# Patient Record
Sex: Female | Born: 1970 | Race: White | Hispanic: No | Smoking: Current every day smoker
Health system: Southern US, Community
[De-identification: ages and names within clinical notes are randomized; demographics above are authoritative.]

## PROBLEM LIST (undated history)

## (undated) DIAGNOSIS — J45909 Unspecified asthma, uncomplicated: Secondary | ICD-10-CM

## (undated) DIAGNOSIS — E079 Disorder of thyroid, unspecified: Secondary | ICD-10-CM

## (undated) DIAGNOSIS — J40 Bronchitis, not specified as acute or chronic: Secondary | ICD-10-CM

## (undated) HISTORY — PX: CHOLECYSTECTOMY: SHX55

## (undated) HISTORY — PX: TUBAL LIGATION: SHX77

---

## 1997-10-29 ENCOUNTER — Encounter: Admission: RE | Admit: 1997-10-29 | Discharge: 1997-10-29 | Payer: Self-pay | Admitting: Family Medicine

## 1997-11-05 ENCOUNTER — Encounter: Admission: RE | Admit: 1997-11-05 | Discharge: 1997-11-05 | Payer: Self-pay | Admitting: Family Medicine

## 1998-02-02 ENCOUNTER — Encounter: Admission: RE | Admit: 1998-02-02 | Discharge: 1998-02-02 | Payer: Self-pay | Admitting: Family Medicine

## 1998-03-03 ENCOUNTER — Encounter: Admission: RE | Admit: 1998-03-03 | Discharge: 1998-03-03 | Payer: Self-pay | Admitting: Sports Medicine

## 1998-03-05 ENCOUNTER — Encounter: Admission: RE | Admit: 1998-03-05 | Discharge: 1998-03-05 | Payer: Self-pay | Admitting: Family Medicine

## 1998-03-17 ENCOUNTER — Other Ambulatory Visit: Admission: RE | Admit: 1998-03-17 | Discharge: 1998-03-17 | Payer: Self-pay | Admitting: Family Medicine

## 1998-03-17 ENCOUNTER — Encounter: Admission: RE | Admit: 1998-03-17 | Discharge: 1998-03-17 | Payer: Self-pay | Admitting: Family Medicine

## 1998-03-24 ENCOUNTER — Encounter: Admission: RE | Admit: 1998-03-24 | Discharge: 1998-03-24 | Payer: Self-pay | Admitting: Family Medicine

## 1998-04-07 ENCOUNTER — Encounter: Admission: RE | Admit: 1998-04-07 | Discharge: 1998-04-07 | Payer: Self-pay | Admitting: Family Medicine

## 1998-04-08 ENCOUNTER — Encounter: Admission: RE | Admit: 1998-04-08 | Discharge: 1998-04-08 | Payer: Self-pay | Admitting: Family Medicine

## 1998-04-23 ENCOUNTER — Encounter: Admission: RE | Admit: 1998-04-23 | Discharge: 1998-04-23 | Payer: Self-pay | Admitting: Family Medicine

## 1999-08-01 ENCOUNTER — Emergency Department (HOSPITAL_COMMUNITY): Admission: EM | Admit: 1999-08-01 | Discharge: 1999-08-01 | Payer: Self-pay | Admitting: *Deleted

## 2000-08-10 ENCOUNTER — Emergency Department (HOSPITAL_COMMUNITY): Admission: EM | Admit: 2000-08-10 | Discharge: 2000-08-10 | Payer: Self-pay | Admitting: Emergency Medicine

## 2000-08-10 ENCOUNTER — Encounter: Payer: Self-pay | Admitting: Emergency Medicine

## 2000-08-31 ENCOUNTER — Ambulatory Visit (HOSPITAL_COMMUNITY): Admission: RE | Admit: 2000-08-31 | Discharge: 2000-08-31 | Payer: Self-pay | Admitting: Family Medicine

## 2000-08-31 ENCOUNTER — Encounter: Payer: Self-pay | Admitting: Family Medicine

## 2000-09-22 ENCOUNTER — Ambulatory Visit (HOSPITAL_COMMUNITY): Admission: RE | Admit: 2000-09-22 | Discharge: 2000-09-22 | Payer: Self-pay | Admitting: Family Medicine

## 2000-09-22 ENCOUNTER — Encounter: Payer: Self-pay | Admitting: Family Medicine

## 2000-09-25 ENCOUNTER — Ambulatory Visit (HOSPITAL_COMMUNITY): Admission: RE | Admit: 2000-09-25 | Discharge: 2000-09-25 | Payer: Self-pay | Admitting: General Surgery

## 2000-09-28 ENCOUNTER — Encounter: Payer: Self-pay | Admitting: Emergency Medicine

## 2000-09-28 ENCOUNTER — Emergency Department (HOSPITAL_COMMUNITY): Admission: EM | Admit: 2000-09-28 | Discharge: 2000-09-28 | Payer: Self-pay | Admitting: Emergency Medicine

## 2000-11-11 ENCOUNTER — Emergency Department (HOSPITAL_COMMUNITY): Admission: EM | Admit: 2000-11-11 | Discharge: 2000-11-11 | Payer: Self-pay | Admitting: Emergency Medicine

## 2000-12-13 ENCOUNTER — Emergency Department (HOSPITAL_COMMUNITY): Admission: EM | Admit: 2000-12-13 | Discharge: 2000-12-13 | Payer: Self-pay | Admitting: Emergency Medicine

## 2000-12-26 ENCOUNTER — Emergency Department (HOSPITAL_COMMUNITY): Admission: EM | Admit: 2000-12-26 | Discharge: 2000-12-26 | Payer: Self-pay | Admitting: Emergency Medicine

## 2001-02-11 ENCOUNTER — Emergency Department (HOSPITAL_COMMUNITY): Admission: EM | Admit: 2001-02-11 | Discharge: 2001-02-11 | Payer: Self-pay | Admitting: Emergency Medicine

## 2002-09-28 ENCOUNTER — Emergency Department (HOSPITAL_COMMUNITY): Admission: AD | Admit: 2002-09-28 | Discharge: 2002-09-28 | Payer: Self-pay | Admitting: Emergency Medicine

## 2002-09-29 ENCOUNTER — Encounter: Payer: Self-pay | Admitting: Emergency Medicine

## 2002-09-30 ENCOUNTER — Emergency Department (HOSPITAL_COMMUNITY): Admission: EM | Admit: 2002-09-30 | Discharge: 2002-09-30 | Payer: Self-pay | Admitting: Emergency Medicine

## 2002-10-08 ENCOUNTER — Emergency Department (HOSPITAL_COMMUNITY): Admission: EM | Admit: 2002-10-08 | Discharge: 2002-10-08 | Payer: Self-pay | Admitting: Emergency Medicine

## 2002-10-08 ENCOUNTER — Encounter: Payer: Self-pay | Admitting: Emergency Medicine

## 2003-10-08 ENCOUNTER — Emergency Department (HOSPITAL_COMMUNITY): Admission: EM | Admit: 2003-10-08 | Discharge: 2003-10-08 | Payer: Self-pay | Admitting: Family Medicine

## 2004-01-28 ENCOUNTER — Other Ambulatory Visit: Admission: RE | Admit: 2004-01-28 | Discharge: 2004-01-28 | Payer: Self-pay | Admitting: Obstetrics and Gynecology

## 2004-02-06 ENCOUNTER — Ambulatory Visit (HOSPITAL_COMMUNITY): Admission: RE | Admit: 2004-02-06 | Discharge: 2004-02-06 | Payer: Self-pay | Admitting: Obstetrics and Gynecology

## 2004-08-09 ENCOUNTER — Emergency Department (HOSPITAL_COMMUNITY): Admission: EM | Admit: 2004-08-09 | Discharge: 2004-08-09 | Payer: Self-pay | Admitting: Emergency Medicine

## 2010-02-06 ENCOUNTER — Encounter: Payer: Self-pay | Admitting: Obstetrics and Gynecology

## 2011-05-23 ENCOUNTER — Emergency Department (HOSPITAL_COMMUNITY): Payer: BC Managed Care – PPO

## 2011-05-23 ENCOUNTER — Emergency Department (HOSPITAL_COMMUNITY)
Admission: EM | Admit: 2011-05-23 | Discharge: 2011-05-24 | Disposition: A | Discharge status: Home / Self Care | Payer: BC Managed Care – PPO | Attending: Emergency Medicine | Admitting: Emergency Medicine

## 2011-05-23 ENCOUNTER — Encounter (HOSPITAL_COMMUNITY): Payer: Self-pay | Admitting: Emergency Medicine

## 2011-05-23 DIAGNOSIS — F17200 Nicotine dependence, unspecified, uncomplicated: Secondary | ICD-10-CM | POA: Insufficient documentation

## 2011-05-23 DIAGNOSIS — R05 Cough: Secondary | ICD-10-CM | POA: Insufficient documentation

## 2011-05-23 DIAGNOSIS — J40 Bronchitis, not specified as acute or chronic: Secondary | ICD-10-CM | POA: Insufficient documentation

## 2011-05-23 DIAGNOSIS — R059 Cough, unspecified: Secondary | ICD-10-CM | POA: Insufficient documentation

## 2011-05-23 MED ORDER — ALBUTEROL SULFATE (5 MG/ML) 0.5% IN NEBU
5.0000 mg | INHALATION_SOLUTION | Freq: Once | RESPIRATORY_TRACT | Status: AC
  Administered 2011-05-24: 5 mg via RESPIRATORY_TRACT
  Filled 2011-05-23: qty 0.5

## 2011-05-23 MED ORDER — IPRATROPIUM BROMIDE 0.02 % IN SOLN
0.5000 mg | Freq: Once | RESPIRATORY_TRACT | Status: AC
  Administered 2011-05-24: 0.5 mg via RESPIRATORY_TRACT
  Filled 2011-05-23: qty 2.5

## 2011-05-23 MED ORDER — ALBUTEROL SULFATE HFA 108 (90 BASE) MCG/ACT IN AERS
2.0000 | INHALATION_SPRAY | RESPIRATORY_TRACT | Status: DC
  Administered 2011-05-24: 2 via RESPIRATORY_TRACT
  Filled 2011-05-23: qty 6.7

## 2011-05-23 MED ORDER — PREDNISONE 20 MG PO TABS
60.0000 mg | ORAL_TABLET | Freq: Once | ORAL | Status: AC
  Administered 2011-05-24: 60 mg via ORAL
  Filled 2011-05-23: qty 2
  Filled 2011-05-23: qty 1

## 2011-05-23 MED ORDER — BENZONATATE 100 MG PO CAPS
200.0000 mg | ORAL_CAPSULE | Freq: Once | ORAL | Status: AC
  Administered 2011-05-24: 200 mg via ORAL
  Filled 2011-05-23 (×2): qty 1

## 2011-05-23 NOTE — ED Provider Notes (Signed)
History     CSN: [id number]  Arrival date & time 05/23/11  2050   First MD Initiated Contact with Patient 05/23/11 2304      Chief Complaint  Patient presents with  . Cough    (Consider location/radiation/quality/duration/timing/severity/associated sxs/prior treatment) Patient is a 41 y.o. female presenting with cough. The history is provided by the patient.  Cough   patient here with cough x2 weeks. She does use tobacco daily. No fever noted. Cough worse after smoking. No vomiting or diarrhea. Has used over-the-counter medications without relief. Cough has been nonproductive and associated with wheezing  History reviewed. No pertinent past medical history.  Past Surgical History  Procedure Date  . Tubal ligation   . Cholecystectomy     No family history on file.  History  Substance Use Topics  . Smoking status: Current Everyday Smoker  . Smokeless tobacco: Not on file  . Alcohol Use: Yes    OB History    Grav Para Term Preterm Abortions TAB SAB Ect Mult Living                  Review of Systems  Respiratory: Positive for cough.   All other systems reviewed and are negative.    Allergies  Penicillins  Home Medications  No current outpatient prescriptions on file.  BP 125/65  Pulse 84  Temp(Src) 98.1 F (36.7 C) (Oral)  Resp 18  SpO2 98%  LMP 05/07/2011  Physical Exam  Nursing note and vitals reviewed. Constitutional: She is oriented to person, place, and time. She appears well-developed and well-nourished.  Non-toxic appearance. No distress.  HENT:  Head: Normocephalic and atraumatic.  Eyes: Conjunctivae, EOM and lids are normal. Pupils are equal, round, and reactive to light.  Neck: Normal range of motion. Neck supple. No tracheal deviation present. No mass present.  Cardiovascular: Normal rate, regular rhythm and normal heart sounds.  Exam reveals no gallop.   No murmur heard. Pulmonary/Chest: Effort normal. No stridor. No respiratory distress.  She has no decreased breath sounds. She has wheezes. She has no rhonchi. She has no rales.  Abdominal: Soft. Normal appearance and bowel sounds are normal. She exhibits no distension. There is no tenderness. There is no rebound and no CVA tenderness.  Musculoskeletal: Normal range of motion. She exhibits no edema and no tenderness.  Neurological: She is alert and oriented to person, place, and time. She has normal strength. No cranial nerve deficit or sensory deficit. GCS eye subscore is 4. GCS verbal subscore is 5. GCS motor subscore is 6.  Skin: Skin is warm and dry. No abrasion and no rash noted.  Psychiatric: She has a normal mood and affect. Her speech is normal and behavior is normal.    ED Course  Procedures (including critical care time)  Labs Reviewed - No data to display Dg Chest 2 View  05/23/2011  *RADIOLOGY REPORT*  Clinical Data: 41 year old female with cough congestion fever or throat.  CHEST - 2 VIEW  Comparison: 08/28/2010 and earlier.  Findings: Stable lung volumes. Normal cardiac size and mediastinal contours.  Visualized tracheal air column is within normal limits. Lung parenchyma is stable and clear.  No pneumothorax or effusion. No acute osseous abnormality identified.    Right upper quadrant surgical clips.  IMPRESSION: Negative, no acute cardiopulmonary abnormality.  Original Report Authenticated By: Harley Hallmark, M.D.     No diagnosis found.    MDM  Patient given prednisone and albuterol inhaler here. Chest is clear results  noted. Will place patient on prednisone taper and albuterol        Toy Baker, MD 05/23/11 2322

## 2011-05-23 NOTE — ED Notes (Signed)
PT. REPORTS PERSISTENT DRY COUGH WITH CHEST CONGESTION FOR 2 WEEKS UNRELIEVED BY OTC MUCINEX.

## 2011-05-24 MED ORDER — PREDNISONE 10 MG PO TABS: 20.0000 mg | ORAL_TABLET | Freq: Every day | ORAL | Status: DC

## 2011-05-24 MED ORDER — BENZONATATE 100 MG PO CAPS: 100.0000 mg | ORAL_CAPSULE | Freq: Three times a day (TID) | ORAL | Status: AC

## 2011-05-24 NOTE — ED Notes (Signed)
Pt. Discharged to home, pt. Alert and oriented, gait steady, respirations even and regular, NAD noted

## 2011-05-24 NOTE — Discharge Instructions (Signed)

## 2011-06-28 ENCOUNTER — Emergency Department (HOSPITAL_COMMUNITY)
Admission: EM | Admit: 2011-06-28 | Discharge: 2011-06-28 | Disposition: A | Discharge status: Home / Self Care | Payer: BC Managed Care – PPO | Attending: Emergency Medicine | Admitting: Emergency Medicine

## 2011-06-28 ENCOUNTER — Encounter (HOSPITAL_COMMUNITY): Payer: Self-pay | Admitting: *Deleted

## 2011-06-28 DIAGNOSIS — R062 Wheezing: Secondary | ICD-10-CM | POA: Insufficient documentation

## 2011-06-28 DIAGNOSIS — R059 Cough, unspecified: Secondary | ICD-10-CM | POA: Insufficient documentation

## 2011-06-28 DIAGNOSIS — R05 Cough: Secondary | ICD-10-CM | POA: Insufficient documentation

## 2011-06-28 DIAGNOSIS — Z880 Allergy status to penicillin: Secondary | ICD-10-CM | POA: Insufficient documentation

## 2011-06-28 DIAGNOSIS — F17200 Nicotine dependence, unspecified, uncomplicated: Secondary | ICD-10-CM | POA: Insufficient documentation

## 2011-06-28 DIAGNOSIS — Z886 Allergy status to analgesic agent status: Secondary | ICD-10-CM | POA: Insufficient documentation

## 2011-06-28 DIAGNOSIS — J40 Bronchitis, not specified as acute or chronic: Secondary | ICD-10-CM

## 2011-06-28 MED ORDER — PREDNISONE 20 MG PO TABS: 60.0000 mg | ORAL_TABLET | Freq: Every day | ORAL | Status: AC

## 2011-06-28 MED ORDER — BENZONATATE 100 MG PO CAPS: 100.0000 mg | ORAL_CAPSULE | Freq: Three times a day (TID) | ORAL | Status: AC | PRN

## 2011-06-28 MED ORDER — ALBUTEROL SULFATE HFA 108 (90 BASE) MCG/ACT IN AERS: 2.0000 | INHALATION_SPRAY | RESPIRATORY_TRACT | Status: DC | PRN

## 2011-06-28 MED ORDER — ALBUTEROL SULFATE (5 MG/ML) 0.5% IN NEBU
5.0000 mg | INHALATION_SOLUTION | Freq: Once | RESPIRATORY_TRACT | Status: AC
  Administered 2011-06-28: 5 mg via RESPIRATORY_TRACT
  Filled 2011-06-28: qty 1

## 2011-06-28 MED ORDER — AZITHROMYCIN 250 MG PO TABS: ORAL_TABLET | ORAL | Status: AC

## 2011-06-28 MED ORDER — PREDNISONE 20 MG PO TABS
60.0000 mg | ORAL_TABLET | Freq: Once | ORAL | Status: AC
  Administered 2011-06-28: 60 mg via ORAL
  Filled 2011-06-28: qty 3

## 2011-06-28 MED ORDER — IPRATROPIUM BROMIDE 0.02 % IN SOLN
0.5000 mg | Freq: Once | RESPIRATORY_TRACT | Status: AC
  Administered 2011-06-28: 0.5 mg via RESPIRATORY_TRACT
  Filled 2011-06-28: qty 2.5

## 2011-06-28 NOTE — ED Provider Notes (Signed)
History    This chart was scribed for Suzi Roots, MD, MD by Smitty Pluck. The patient was seen in room STRE3 and the patient's care was started at 5:00PM.   CSN: [id number]  Arrival date & time 06/28/11  1617   None     Chief Complaint  Patient presents with  . Cough    (Consider location/radiation/quality/duration/timing/severity/associated sxs/prior treatment) The history is provided by the patient.   Sheila Conway is a 41 y.o. female who presents to the Emergency Department complaining of moderate productive cough. Pt reports that she is a smoker. Reports having bronchitis before and this feels similar. Denies cold symptoms rhinorrhea, congestion, sore throat. Denies nausea and vomiting. Symptoms have been constant since onset. Pt reports prednisone relieves symptoms. Productive cough w green sputum. +wheezing. Denies prior admit re wheezing. Uses inhalers prn. No chest pain. No leg pain or swelling. No known ill contacts. subj fever, no chills/sweats.   History reviewed. No pertinent past medical history.  Past Surgical History  Procedure Date  . Tubal ligation   . Cholecystectomy     History reviewed. No pertinent family history.  History  Substance Use Topics  . Smoking status: Current Everyday Smoker  . Smokeless tobacco: Not on file  . Alcohol Use: Yes    OB History    Grav Para Term Preterm Abortions TAB SAB Ect Mult Living                  Review of Systems  Constitutional: Negative for chills.  HENT: Negative for congestion, sore throat and rhinorrhea.   Respiratory: Positive for wheezing.   Cardiovascular: Negative for chest pain and leg swelling.  Gastrointestinal: Negative for nausea and vomiting.  Neurological: Negative for headaches.    Allergies  Penicillins; Codeine; and Latex  Home Medications   Current Outpatient Rx  Name Route Sig Dispense Refill  . ALBUTEROL SULFATE HFA 108 (90 BASE) MCG/ACT IN AERS Inhalation Inhale 2 puffs into  the lungs every 6 (six) hours as needed. For wheezing    . MUCINEX FAST-MAX COLD FLU PO Oral Take 30 mLs by mouth 4 (four) times daily as needed. For cold/flu symptoms and chest congestion    . PSEUDOEPHEDRINE HCL 30 MG PO TABS Oral Take 30 mg by mouth every 4 (four) hours as needed. For cough      BP 107/72  Pulse 102  Temp(Src) 98.5 F (36.9 C) (Oral)  Resp 18  SpO2 95%  Physical Exam  Nursing note and vitals reviewed. Constitutional: She is oriented to person, place, and time. She appears well-developed and well-nourished. No distress.  HENT:  Nose: Nose normal.  Mouth/Throat: Oropharynx is clear and moist.  Eyes: Conjunctivae are normal. No scleral icterus.  Neck: Normal range of motion. Neck supple. No tracheal deviation present.  Cardiovascular: Normal rate, regular rhythm, normal heart sounds and intact distal pulses.  Exam reveals no gallop and no friction rub.   No murmur heard. Pulmonary/Chest: Effort normal. No respiratory distress. She has wheezes.  Abdominal: Soft. Normal appearance. She exhibits no distension. There is no tenderness.  Musculoskeletal: She exhibits no edema and no tenderness.  Neurological: She is alert and oriented to person, place, and time.  Skin: Skin is warm and dry. No rash noted.  Psychiatric: She has a normal mood and affect.    ED Course  Procedures (including critical care time) DIAGNOSTIC STUDIES: Oxygen Saturation is 95% on room air, normal by my interpretation.    COORDINATION  OF CARE: 5:02PM EDP discusses pt ED treatment with pt.  5:15PM EDP ordered medication: proventil 5 mg, atrovent 0.5 mg, deltasone 60 mg     MDM  I personally performed the services described in this documentation, which was scribed in my presence. The recorded information has been reviewed and considered. Suzi Roots, MD   Albuterol and atrovent neb.   pred po.    Recheck no wheezing or increased wob.   Suzi Roots, MD 06/28/11 [phone]

## 2011-06-28 NOTE — ED Notes (Signed)
To ED for eval of coughing and productive cough 1st thing in the morning. Hx of bronchitis.

## 2011-06-28 NOTE — Discharge Instructions (Signed)
Use albuterol inhaler as need. Avoid smoking. Take prednisone and zithromax as prescribed.  Follow up with primary care doctor in 1 week if symptoms fail to improve/resolve. Return to ER if worse, trouble breathing, other concern.       Bronchitis Bronchitis is the body's way of reacting to injury and/or infection (inflammation) of the bronchi. Bronchi are the air tubes that extend from the windpipe into the lungs. If the inflammation becomes severe, it may cause shortness of breath. CAUSES  Inflammation may be caused by:  A virus.   Germs (bacteria).   Dust.   Allergens.   Pollutants and many other irritants.  The cells lining the bronchial tree are covered with tiny hairs (cilia). These constantly beat upward, away from the lungs, toward the mouth. This keeps the lungs free of pollutants. When these cells become too irritated and are unable to do their job, mucus begins to develop. This causes the characteristic cough of bronchitis. The cough clears the lungs when the cilia are unable to do their job. Without either of these protective mechanisms, the mucus would settle in the lungs. Then you would develop pneumonia. Smoking is a common cause of bronchitis and can contribute to pneumonia. Stopping this habit is the single most important thing you can do to help yourself. TREATMENT   Your caregiver may prescribe an antibiotic if the cough is caused by bacteria. Also, medicines that open up your airways make it easier to breathe. Your caregiver may also recommend or prescribe an expectorant. It will loosen the mucus to be coughed up. Only take over-the-counter or prescription medicines for pain, discomfort, or fever as directed by your caregiver.   Removing whatever causes the problem (smoking, for example) is critical to preventing the problem from getting worse.   Cough suppressants may be prescribed for relief of cough symptoms.   Inhaled medicines may be prescribed to help with  symptoms now and to help prevent problems from returning.   For those with recurrent (chronic) bronchitis, there may be a need for steroid medicines.  SEEK IMMEDIATE MEDICAL CARE IF:   During treatment, you develop more pus-like mucus (purulent sputum).   You have a fever.   Your baby is older than 3 months with a rectal temperature of 102 F (38.9 C) or higher.   Your baby is 45 months old or younger with a rectal temperature of 100.4 F (38 C) or higher.   You become progressively more ill.   You have increased difficulty breathing, wheezing, or shortness of breath.  It is necessary to seek immediate medical care if you are elderly or sick from any other disease. MAKE SURE YOU:   Understand these instructions.   Will watch your condition.   Will get help right away if you are not doing well or get worse.  Document Released: 01/03/2005 Document Revised: 12/23/2010 Document Reviewed: 11/13/2007 [facility] Patient Information 2012 [place], [place].

## 2011-06-28 NOTE — ED Notes (Signed)
Pt states she was seen at [facility] 1 month ago for same sx. States sx resolved while she was taking medications prescribed. Sx returned 2-3 days after she finished meds. States cough has gotten progressively worse and feels tightness in her chest from coughing.

## 2011-07-13 ENCOUNTER — Encounter (HOSPITAL_COMMUNITY): Payer: Self-pay | Admitting: Emergency Medicine

## 2011-07-13 ENCOUNTER — Other Ambulatory Visit: Payer: Self-pay

## 2011-07-13 ENCOUNTER — Emergency Department (HOSPITAL_COMMUNITY)
Admission: EM | Admit: 2011-07-13 | Discharge: 2011-07-13 | Disposition: A | Discharge status: Home / Self Care | Payer: BC Managed Care – PPO | Attending: Emergency Medicine | Admitting: Emergency Medicine

## 2011-07-13 ENCOUNTER — Emergency Department (HOSPITAL_COMMUNITY): Payer: BC Managed Care – PPO

## 2011-07-13 DIAGNOSIS — R079 Chest pain, unspecified: Secondary | ICD-10-CM | POA: Insufficient documentation

## 2011-07-13 DIAGNOSIS — J45909 Unspecified asthma, uncomplicated: Secondary | ICD-10-CM | POA: Insufficient documentation

## 2011-07-13 DIAGNOSIS — R091 Pleurisy: Secondary | ICD-10-CM

## 2011-07-13 DIAGNOSIS — F17200 Nicotine dependence, unspecified, uncomplicated: Secondary | ICD-10-CM | POA: Insufficient documentation

## 2011-07-13 HISTORY — DX: Unspecified asthma, uncomplicated: J45.909

## 2011-07-13 LAB — CBC
Hemoglobin: 14.1 g/dL (ref 12.0–15.0)
MCH: 30.3 pg (ref 26.0–34.0)
MCV: 85.6 fL (ref 78.0–100.0)
RBC: 4.66 MIL/uL (ref 3.87–5.11)
WBC: 7.1 10*3/uL (ref 4.0–10.5)

## 2011-07-13 LAB — BASIC METABOLIC PANEL
CO2: 25 mEq/L (ref 19–32)
GFR calc non Af Amer: 88 mL/min — ABNORMAL LOW (ref >90)
Glucose, Bld: 95 mg/dL (ref 70–99)
Potassium: 3.6 mEq/L (ref 3.5–5.1)
Sodium: 138 mEq/L (ref 135–145)

## 2011-07-13 LAB — POCT I-STAT TROPONIN I: Troponin i, poc: 0 ng/mL (ref 0.00–0.08)

## 2011-07-13 MED ORDER — IPRATROPIUM BROMIDE 0.02 % IN SOLN
0.5000 mg | Freq: Once | RESPIRATORY_TRACT | Status: AC
  Administered 2011-07-13: 0.5 mg via RESPIRATORY_TRACT
  Filled 2011-07-13: qty 2.5

## 2011-07-13 MED ORDER — OXYCODONE-ACETAMINOPHEN 5-325 MG PO TABS: 1.0000 | ORAL_TABLET | Freq: Four times a day (QID) | ORAL | Status: AC | PRN

## 2011-07-13 MED ORDER — ALBUTEROL SULFATE (5 MG/ML) 0.5% IN NEBU
5.0000 mg | INHALATION_SOLUTION | Freq: Once | RESPIRATORY_TRACT | Status: AC
  Administered 2011-07-13: 5 mg via RESPIRATORY_TRACT
  Filled 2011-07-13: qty 1

## 2011-07-13 NOTE — ED Notes (Signed)
Patient with three day history of sharp chest pain and pressure.  Patient states she has had some dizziness with the pains and some shortness of breath.

## 2011-07-13 NOTE — ED Provider Notes (Addendum)
History     CSN: [id number]  Arrival date & time 07/13/11  1651   First MD Initiated Contact with Patient 07/13/11 1914      Chief Complaint  Patient presents with  . Chest Pain    (Consider location/radiation/quality/duration/timing/severity/associated sxs/prior treatment) Patient is a 41 y.o. female presenting with chest pain. The history is provided by the patient.  Chest Pain The chest pain began 3 - 5 days ago. Duration of episode(s) is 3 days. Chest pain occurs constantly. The chest pain is worsening. The pain is associated with breathing and coughing. At its most intense, the pain is at 8/10. The pain is currently at 8/10. The severity of the pain is moderate. The quality of the pain is described as pleuritic, sharp and stabbing. The pain does not radiate. Chest pain is worsened by deep breathing and smoking. Primary symptoms include shortness of breath, cough and wheezing. Pertinent negatives for primary symptoms include no abdominal pain, no nausea, no vomiting and no dizziness.  Pertinent negatives for associated symptoms include no diaphoresis and no near-syncope. Risk factors include no known risk factors.  Pertinent negatives for past medical history include no hyperlipidemia and no hypertension.     Past Medical History  Diagnosis Date  . Asthma     Past Surgical History  Procedure Date  . Tubal ligation   . Cholecystectomy     No family history on file.  History  Substance Use Topics  . Smoking status: Current Everyday Smoker  . Smokeless tobacco: Not on file  . Alcohol Use: Yes    OB History    Grav Para Term Preterm Abortions TAB SAB Ect Mult Living                  Review of Systems  Constitutional: Negative for diaphoresis.  Respiratory: Positive for cough, shortness of breath and wheezing.   Cardiovascular: Positive for chest pain. Negative for near-syncope.  Gastrointestinal: Negative for nausea, vomiting and abdominal pain.  Neurological:  Negative for dizziness.  All other systems reviewed and are negative.    Allergies  Penicillins; Codeine; and Latex  Home Medications   Current Outpatient Rx  Name Route Sig Dispense Refill  . ALBUTEROL SULFATE HFA 108 (90 BASE) MCG/ACT IN AERS Inhalation Inhale 2 puffs into the lungs every 6 (six) hours as needed. For wheezing      BP 105/59  Pulse 87  Temp 98.4 F (36.9 C) (Oral)  Resp 16  SpO2 96%  LMP 06/29/2011  Physical Exam  Nursing note and vitals reviewed. Constitutional: She is oriented to person, place, and time. She appears well-developed and well-nourished. No distress.  HENT:  Head: Normocephalic and atraumatic.  Mouth/Throat: Oropharynx is clear and moist.  Eyes: Conjunctivae and EOM are normal. Pupils are equal, round, and reactive to light.  Neck: Normal range of motion. Neck supple.  Cardiovascular: Normal rate, regular rhythm and intact distal pulses.   No murmur heard. Pulmonary/Chest: Effort normal. No respiratory distress. She has decreased breath sounds. She has no wheezes. She has no rales. She exhibits tenderness.  Abdominal: Soft. She exhibits no distension. There is no tenderness. There is no rebound and no guarding.  Musculoskeletal: Normal range of motion. She exhibits no edema and no tenderness.  Neurological: She is alert and oriented to person, place, and time.  Skin: Skin is warm and dry. No rash noted. No erythema.  Psychiatric: She has a normal mood and affect. Her behavior is normal.  ED Course  Procedures (including critical care time)  Labs Reviewed  BASIC METABOLIC PANEL - Abnormal; Notable for the following:    GFR calc non Af Amer 88 (*)     All other components within normal limits  CBC  POCT I-STAT TROPONIN I   Dg Chest 2 View  07/13/2011  *RADIOLOGY REPORT*  Clinical Data: Chest pain.  Shortness of breath.  Lightheadedness. Tobacco use.  CHEST - 2 VIEW  Comparison: 05/23/2011  Findings: Cardiac and mediastinal contours  appear normal.  The lungs appear clear.  No pleural effusion is identified.  IMPRESSION:  No significant abnormality identified.  Original Report Authenticated By: Dellia Cloud, M.D.    Date: 07/13/2011  Rate: 89  Rhythm: normal sinus rhythm  QRS Axis: normal  Intervals: normal  ST/T Wave abnormalities: normal  Conduction Disutrbances: none  Narrative Interpretation: unremarkable      1. Pleurisy       MDM   Pt with atypical story for CP most consistent with pleurisy.  Recent bronchitis and abx/prednisone.  Pain started 3 days ago.  TIMI 0 and only risk factor is tobacco use.  PERC neg.  EKG wnl.  CXR, CBC, BMP, CE all wnl.  Pt has decreased breath sounds on exam so will give albuterol/atrovent  8:30 PM Pt feeling much better after treatment and d/ced home with pain control.       Gwyneth Sprout, MD 07/13/11 2030  Gwyneth Sprout, MD 07/13/11 2032

## 2011-07-13 NOTE — ED Notes (Signed)
C/o stabbing L sided chest pain that radiates to back, sob, dizziness, and nausea x 2 days.

## 2011-08-28 ENCOUNTER — Encounter (HOSPITAL_COMMUNITY): Payer: Self-pay | Admitting: *Deleted

## 2011-08-28 ENCOUNTER — Emergency Department (HOSPITAL_COMMUNITY)
Admission: EM | Admit: 2011-08-28 | Discharge: 2011-08-28 | Disposition: A | Discharge status: Home / Self Care | Payer: Worker's Compensation | Attending: Emergency Medicine | Admitting: Emergency Medicine

## 2011-08-28 ENCOUNTER — Emergency Department (HOSPITAL_COMMUNITY): Payer: Worker's Compensation

## 2011-08-28 DIAGNOSIS — Y990 Civilian activity done for income or pay: Secondary | ICD-10-CM | POA: Insufficient documentation

## 2011-08-28 DIAGNOSIS — Y9269 Other specified industrial and construction area as the place of occurrence of the external cause: Secondary | ICD-10-CM | POA: Insufficient documentation

## 2011-08-28 DIAGNOSIS — S93409A Sprain of unspecified ligament of unspecified ankle, initial encounter: Secondary | ICD-10-CM | POA: Insufficient documentation

## 2011-08-28 DIAGNOSIS — W010XXA Fall on same level from slipping, tripping and stumbling without subsequent striking against object, initial encounter: Secondary | ICD-10-CM | POA: Insufficient documentation

## 2011-08-28 DIAGNOSIS — S93402A Sprain of unspecified ligament of left ankle, initial encounter: Secondary | ICD-10-CM

## 2011-08-28 MED ORDER — TRAMADOL HCL 50 MG PO TABS: 50.0000 mg | ORAL_TABLET | Freq: Four times a day (QID) | ORAL | Status: AC | PRN

## 2011-08-28 MED ORDER — TRAMADOL HCL 50 MG PO TABS
50.0000 mg | ORAL_TABLET | Freq: Once | ORAL | Status: AC
  Administered 2011-08-28: 50 mg via ORAL
  Filled 2011-08-28: qty 1

## 2011-08-28 NOTE — ED Notes (Signed)
PT states at 0530 she stepped on sidewalk wrong it hit sidewalk  With left ankle but pain with walking.  Abrasions to bilateral knees.  No head injury

## 2011-08-28 NOTE — ED Provider Notes (Signed)
History  Scribed for Gerhard Munch, MD, the patient was seen in room TR10C/TR10C. This chart was scribed by Candelaria Stagers. The patient's care started at 2:29 PM   CSN: [id number]  Arrival date & time 08/28/11  1309   First MD Initiated Contact with Patient 08/28/11 1347      No chief complaint on file.    The history is provided by the patient.   Sheila Conway is a 41 y.o. female who presents to the Emergency Department complaining of left ankle pain after stepping off a sidewalk wrong earlier this morning while at work and falling.  She denies hitting her head, LOC, or any other injuries.  She has taken nothing for the pain.   Past Medical History  Diagnosis Date  . Asthma     Past Surgical History  Procedure Date  . Tubal ligation   . Cholecystectomy     No family history on file.  History  Substance Use Topics  . Smoking status: Current Everyday Smoker  . Smokeless tobacco: Not on file  . Alcohol Use: No    OB History    Grav Para Term Preterm Abortions TAB SAB Ect Mult Living                  Review of Systems  Constitutional:       Per HPI, otherwise negative  HENT:       Per HPI, otherwise negative  Eyes: Negative.   Respiratory:       Per HPI, otherwise negative  Cardiovascular:       Per HPI, otherwise negative  Gastrointestinal: Negative for vomiting.  Genitourinary: Negative.   Musculoskeletal: Positive for arthralgias (left ankle tenderness).       Per HPI, otherwise negative  Skin: Negative.   Neurological: Negative for syncope.    Allergies  Penicillins; Codeine; and Latex  Home Medications   Current Outpatient Rx  Name Route Sig Dispense Refill  . ALBUTEROL SULFATE HFA 108 (90 BASE) MCG/ACT IN AERS Inhalation Inhale 2 puffs into the lungs every 6 (six) hours as needed. For wheezing      BP 118/65  Pulse 82  Temp 98.1 F (36.7 C) (Oral)  Resp 18  SpO2 98%  Physical Exam  Nursing note and vitals  reviewed. Constitutional: She is oriented to person, place, and time. She appears well-developed and well-nourished. No distress.  HENT:  Head: Normocephalic and atraumatic.  Eyes: Conjunctivae and EOM are normal.  Cardiovascular: Normal rate and regular rhythm.   Pulmonary/Chest: Effort normal and breath sounds normal. No stridor. No respiratory distress.  Abdominal: She exhibits no distension.  Musculoskeletal: She exhibits no edema.       No left knee tenderness.  No left proximal tib/fib tenderness.  Medial malleolus tenderness.  Diffuse lateral malleolus.  No fifth metatarsal.  Some proximal metatarsal tenderness.    Neurological: She is alert and oriented to person, place, and time. No cranial nerve deficit.  Skin: Skin is warm and dry.  Psychiatric: She has a normal mood and affect.    ED Course  Procedures   DIAGNOSTIC STUDIES: Oxygen Saturation is 98% on room air, normal by my interpretation.    COORDINATION OF CARE:  13:18 Ordered: DG Foot Complete Left; DG Ankle Complete Left   Labs Reviewed - No data to display Dg Ankle Complete Left  08/28/2011  *RADIOLOGY REPORT*  Clinical Data: Twisting injury to the left foot and ankle earlier today.  LEFT ANKLE COMPLETE -  3+ VIEW  Comparison: Left foot x-rays obtained concurrently.  Findings: No evidence of acute, subacute, or healed fractures. Ankle mortise intact with well-preserved joint space.  No intrinsic osseous abnormalities.  No evidence of a significant joint effusion.  IMPRESSION: Normal examination.  Original Report Authenticated By: Arnell Sieving, M.D.   Dg Foot Complete Left  08/28/2011  *RADIOLOGY REPORT*  Clinical Data: Twisting injury to the left foot and ankle earlier today.  Pain across the base of the metatarsals.  LEFT FOOT - COMPLETE 3+ VIEW  Comparison: Left ankle x-rays obtained concurrently.  Findings: No evidence of acute or subacute fracture or dislocation. Well-preserved joint spaces.  Well-preserved  bone mineral density. No intrinsic osseous abnormalities.  IMPRESSION: Normal examination.  Original Report Authenticated By: Arnell Sieving, M.D.     No diagnosis found.    MDM  I personally performed the services described in this documentation, which was scribed in my presence. The recorded information has been reviewed and considered.  This generally well-appearing female presents following a missed step earlier today.  On exam she is in no distress, she has superficial abrasions to both knees, but no tenderness nor any lack of functionality of the knees.  She does have tenderness to palpation about the ankle, and given that her x-rays were unremarkable she was treated for an ankle sprain, discharged in stable condition with analgesics.    Gerhard Munch, MD 08/28/11 [phone]

## 2011-08-28 NOTE — ED Notes (Signed)
Patient transported to X-ray 

## 2011-08-28 NOTE — Progress Notes (Signed)
Orthopedic Tech Progress Note Patient Details:  Sheila Conway August 10, 1970 [id number]  Ortho Devices Type of Ortho Device: Crutches;Ankle Air splint Ortho Device/Splint Location: (L) LE Ortho Device/Splint Interventions: Application   Jennye Moccasin 08/28/2011, 2:50 PM

## 2011-08-28 NOTE — ED Notes (Signed)
Pt. Returned from xray 

## 2012-09-10 IMAGING — CR DG FOOT COMPLETE 3+V*L*
3 series · 3 of 3 positions shown · non-contrast
Comparison: Left ankle x-rays obtained concurrently.

CLINICAL DATA: Twisting injury to the left foot and ankle earlier
today.  Pain across the base of the metatarsals.

LEFT FOOT - COMPLETE 3+ VIEW

[t foot lat left]
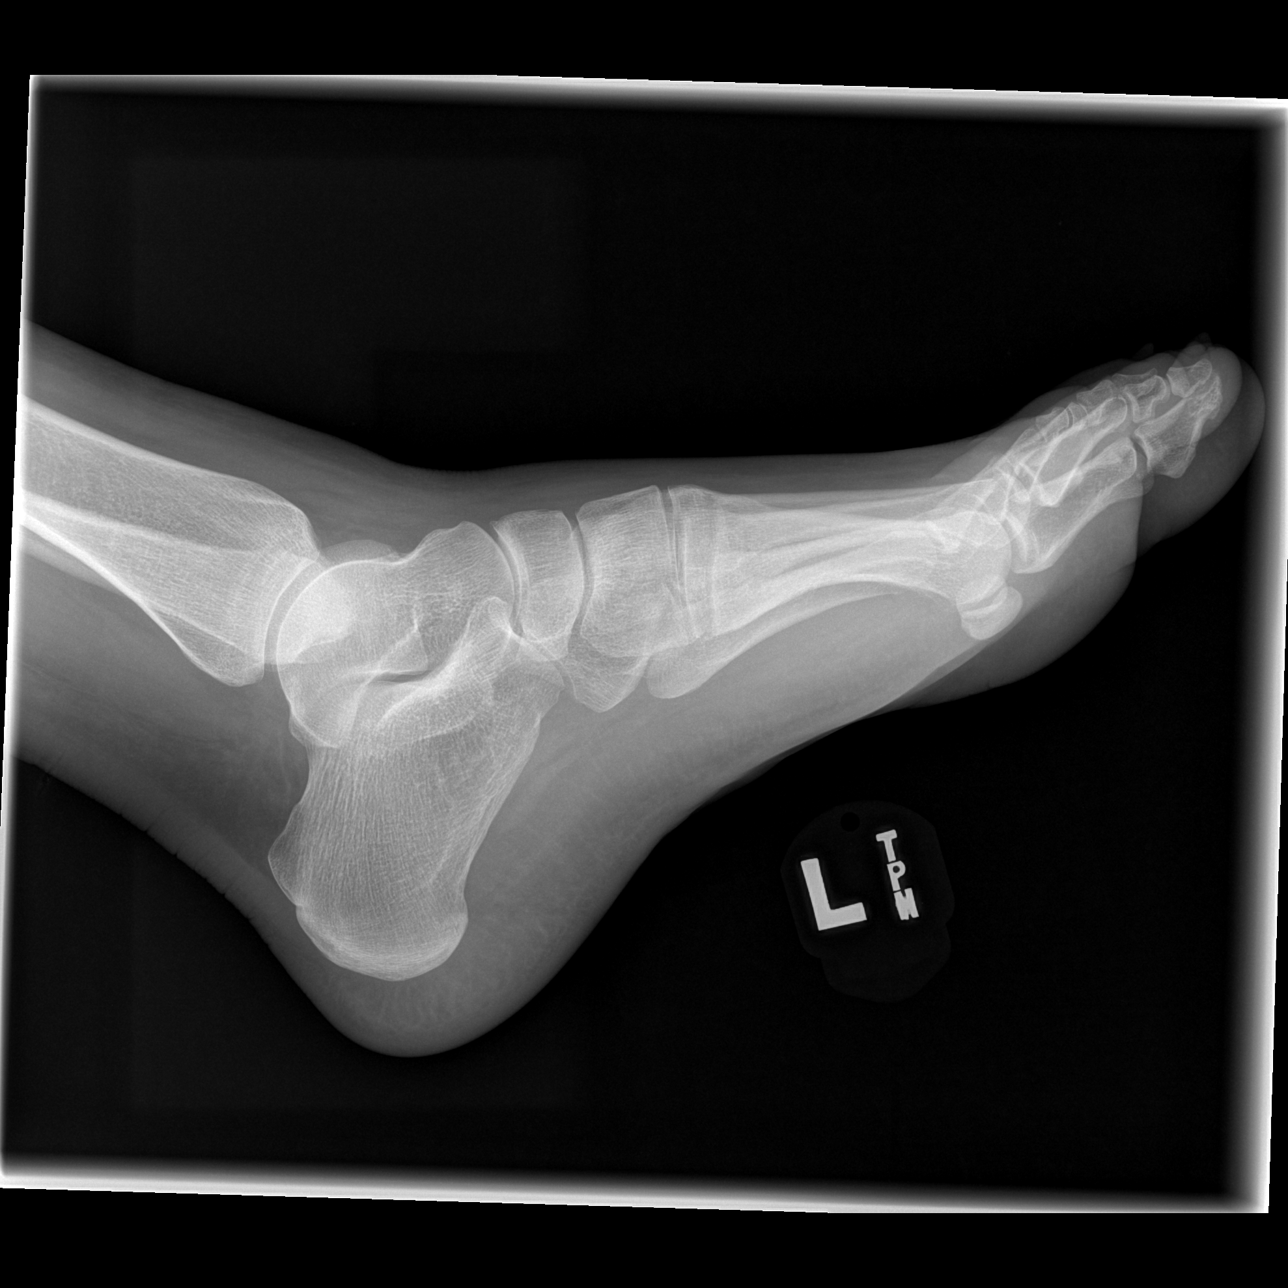

[t foot ap left]
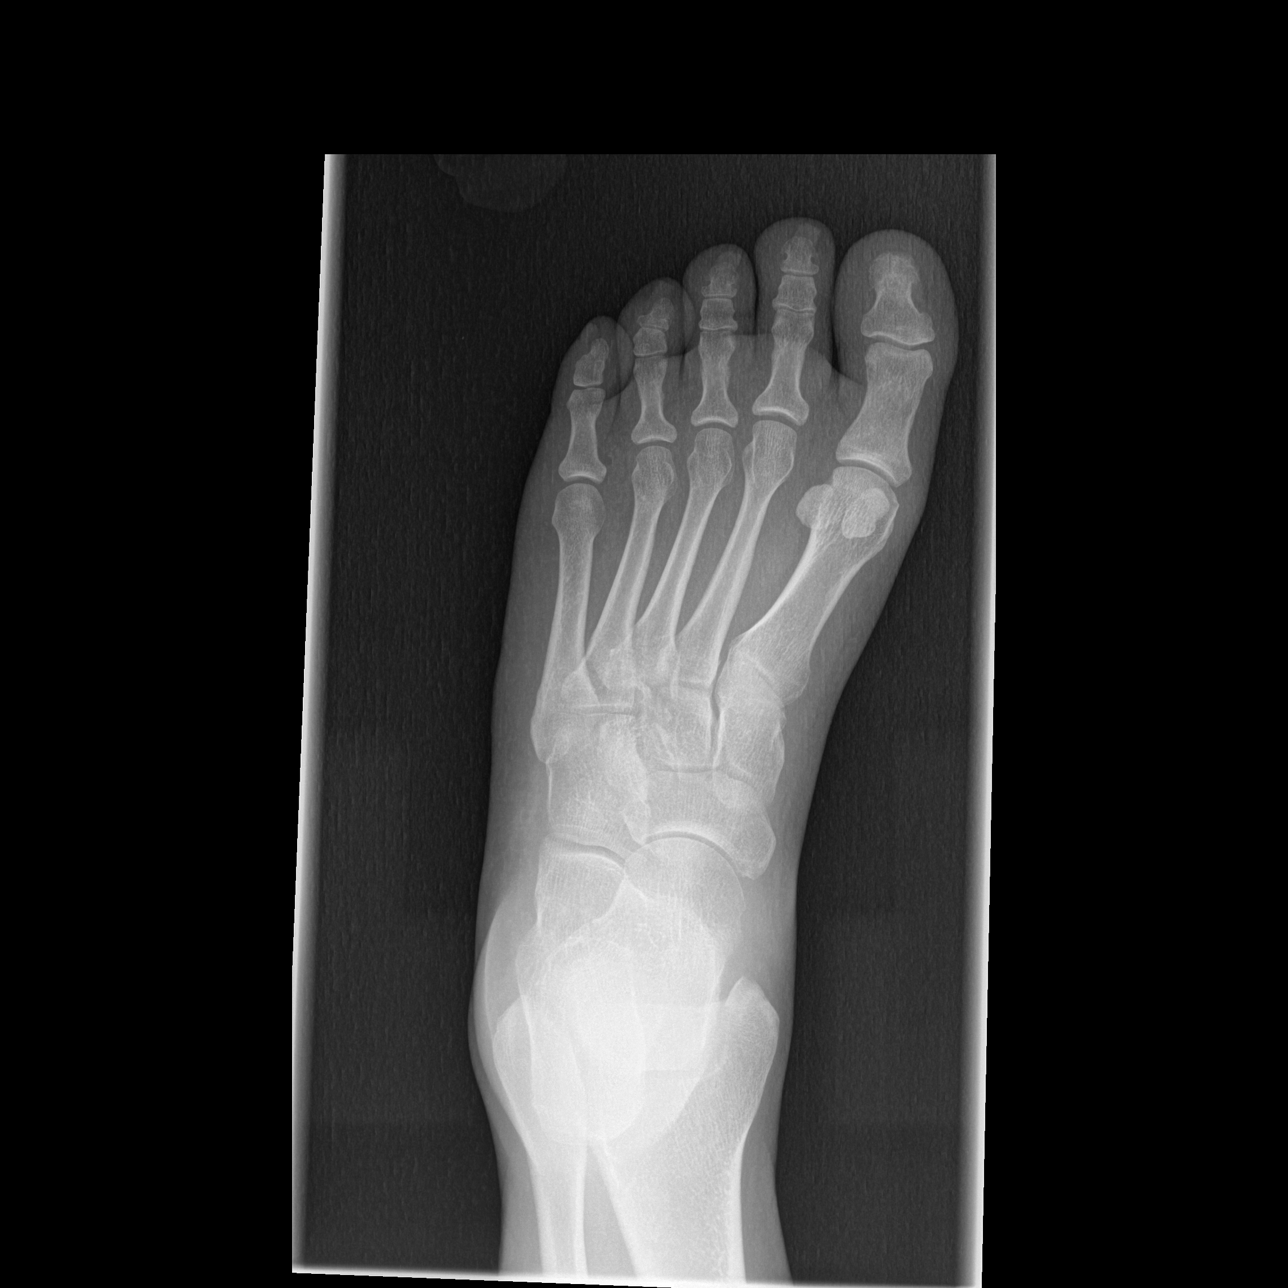

[t foot oblique left]
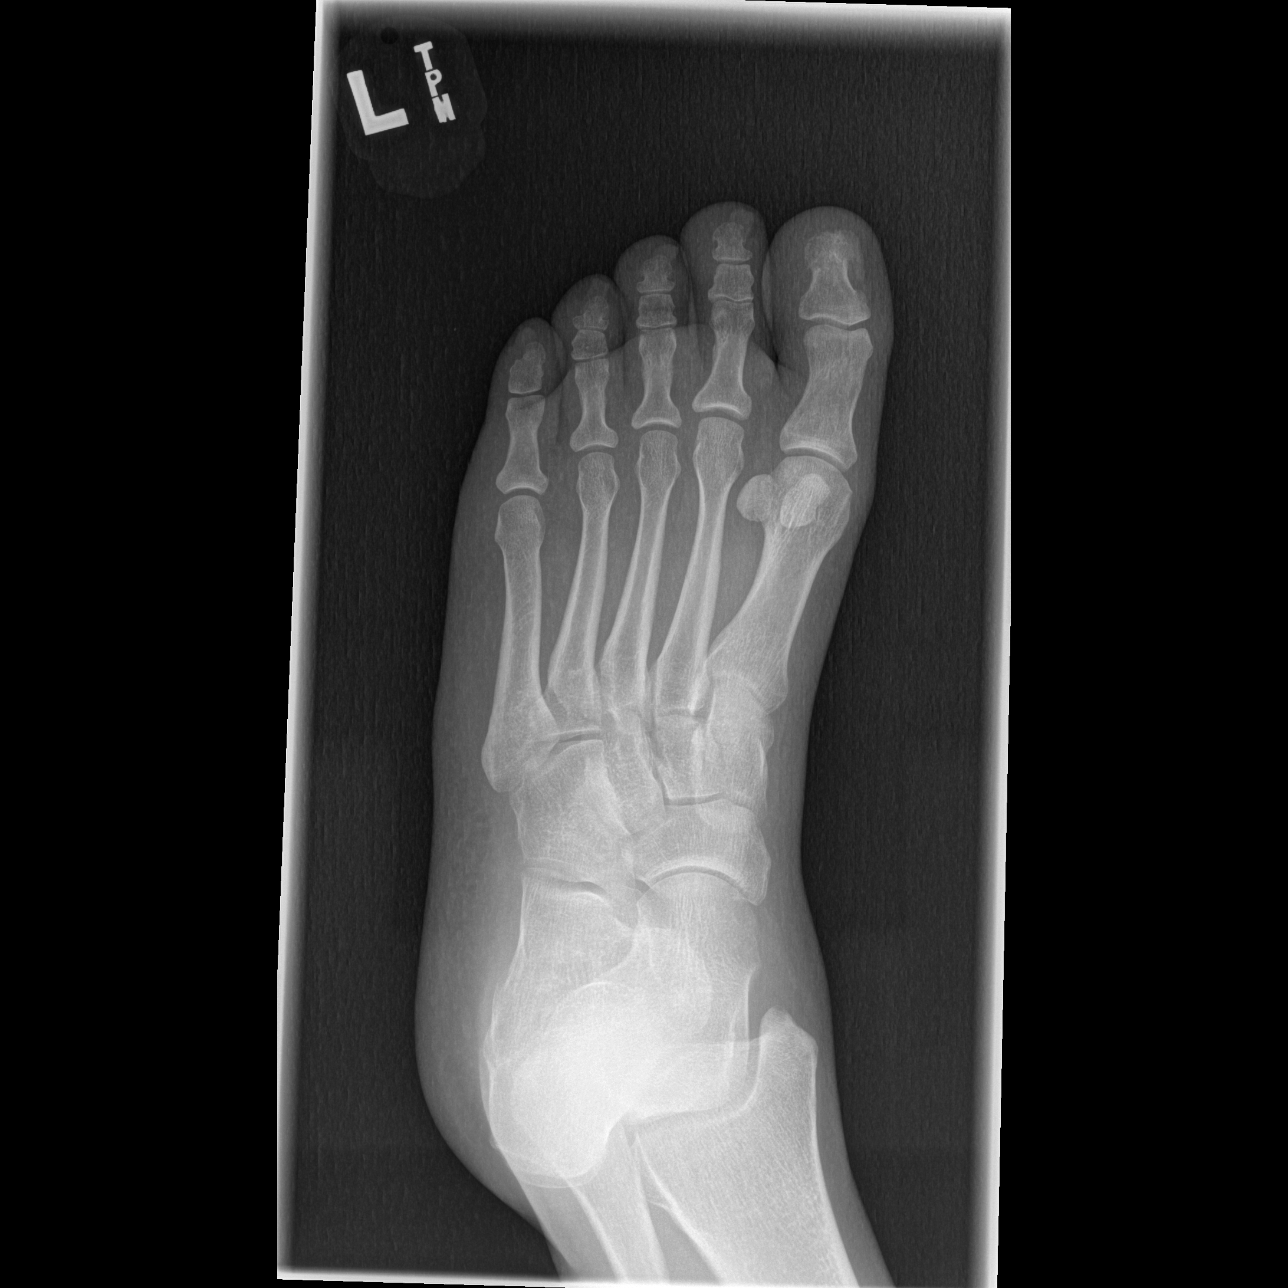

[3 of 3 positions shown; findings below may reference images not displayed]

FINDINGS: No evidence of acute or subacute fracture or dislocation.
Well-preserved joint spaces.  Well-preserved bone mineral density.
No intrinsic osseous abnormalities.
IMPRESSION: Normal examination.

## 2013-08-04 ENCOUNTER — Emergency Department (HOSPITAL_COMMUNITY)
Admission: EM | Admit: 2013-08-04 | Discharge: 2013-08-04 | Disposition: A | Discharge status: Home / Self Care | Payer: Medicaid Other | Attending: Emergency Medicine | Admitting: Emergency Medicine

## 2013-08-04 ENCOUNTER — Encounter (HOSPITAL_COMMUNITY): Payer: Self-pay | Admitting: Emergency Medicine

## 2013-08-04 DIAGNOSIS — T6391XA Toxic effect of contact with unspecified venomous animal, accidental (unintentional), initial encounter: Secondary | ICD-10-CM | POA: Insufficient documentation

## 2013-08-04 DIAGNOSIS — Z91040 Latex allergy status: Secondary | ICD-10-CM | POA: Diagnosis not present

## 2013-08-04 DIAGNOSIS — Z880 Allergy status to penicillin: Secondary | ICD-10-CM | POA: Diagnosis not present

## 2013-08-04 DIAGNOSIS — Z791 Long term (current) use of non-steroidal anti-inflammatories (NSAID): Secondary | ICD-10-CM | POA: Diagnosis not present

## 2013-08-04 DIAGNOSIS — T63461A Toxic effect of venom of wasps, accidental (unintentional), initial encounter: Secondary | ICD-10-CM | POA: Insufficient documentation

## 2013-08-04 DIAGNOSIS — F17200 Nicotine dependence, unspecified, uncomplicated: Secondary | ICD-10-CM | POA: Diagnosis not present

## 2013-08-04 DIAGNOSIS — J45909 Unspecified asthma, uncomplicated: Secondary | ICD-10-CM | POA: Insufficient documentation

## 2013-08-04 DIAGNOSIS — Y929 Unspecified place or not applicable: Secondary | ICD-10-CM | POA: Insufficient documentation

## 2013-08-04 DIAGNOSIS — Z79899 Other long term (current) drug therapy: Secondary | ICD-10-CM | POA: Diagnosis not present

## 2013-08-04 DIAGNOSIS — Y939 Activity, unspecified: Secondary | ICD-10-CM | POA: Insufficient documentation

## 2013-08-04 DIAGNOSIS — T63451A Toxic effect of venom of hornets, accidental (unintentional), initial encounter: Secondary | ICD-10-CM

## 2013-08-04 MED ORDER — PREDNISONE 10 MG PO TABS: 20.0000 mg | ORAL_TABLET | Freq: Every day | ORAL | Status: AC

## 2013-08-04 MED ORDER — FAMOTIDINE IN NACL 20-0.9 MG/50ML-% IV SOLN
INTRAVENOUS | Status: AC
  Administered 2013-08-04: 20 mg
  Filled 2013-08-04: qty 50

## 2013-08-04 MED ORDER — EPINEPHRINE 0.3 MG/0.3ML IJ SOAJ: 0.3000 mg | INTRAMUSCULAR | Status: AC | PRN

## 2013-08-04 MED ORDER — METHYLPREDNISOLONE SODIUM SUCC 125 MG IJ SOLR
INTRAMUSCULAR | Status: AC
  Administered 2013-08-04: 125 mg
  Filled 2013-08-04: qty 2

## 2013-08-04 MED ORDER — DIPHENHYDRAMINE HCL 25 MG PO TABS: 25.0000 mg | ORAL_TABLET | Freq: Four times a day (QID) | ORAL | Status: DC

## 2013-08-04 MED ORDER — DIPHENHYDRAMINE HCL 50 MG/ML IJ SOLN
INTRAMUSCULAR | Status: AC
Start: 2013-08-04 — End: 2013-08-04
  Administered 2013-08-04: 50 mg
  Filled 2013-08-04: qty 1

## 2013-08-04 MED ORDER — EPINEPHRINE HCL 1 MG/ML IJ SOLN
INTRAMUSCULAR | Status: AC
  Administered 2013-08-04: 0.3 mg via INTRAMUSCULAR
  Filled 2013-08-04: qty 1

## 2013-08-04 NOTE — Discharge Instructions (Signed)
Bee, Wasp, or Hornet Sting Your caregiver has diagnosed you as having an insect sting. An insect sting appears as a red lump in the skin that sometimes has a tiny hole in the center, or it may have a stinger in the center of the wound. The most common stings are from wasps, hornets and bees. Individuals have different reactions to insect stings.  A normal reaction may cause pain, swelling, and redness around the sting site.  A localized allergic reaction may cause swelling and redness that extends beyond the sting site.  A large local reaction may continue to develop over the next 12 to 36 hours.  On occasion, the reactions can be severe (anaphylactic reaction). An anaphylactic reaction may cause wheezing; difficulty breathing; chest pain; fainting; raised, itchy, red patches on the skin; a sick feeling to your stomach (nausea); vomiting; cramping; or diarrhea. If you have had an anaphylactic reaction to an insect sting in the past, you are more likely to have one again. HOME CARE INSTRUCTIONS   With bee stings, a small sac of poison is left in the wound. Brushing across this with something such as a credit card, or anything similar, will help remove this and decrease the amount of the reaction. This same procedure will not help a wasp sting as they do not leave behind a stinger and poison sac.  Apply a cold compress for 10 to 20 minutes every hour for 1 to 2 days, depending on severity, to reduce swelling and itching.  To lessen pain, a paste made of water and baking soda may be rubbed on the bite or sting and left on for 5 minutes.  To relieve itching and swelling, you may use take medication or apply medicated creams or lotions as directed.  Only take over-the-counter or prescription medicines for pain, discomfort, or fever as directed by your caregiver.  Wash the sting site daily with soap and water. Apply antibiotic ointment on the sting site as directed.  If you suffered a severe  reaction:  If you did not require hospitalization, an adult will need to stay with you for 24 hours in case the symptoms return.  You may need to wear a medical bracelet or necklace stating the allergy.  You and your family need to learn when and how to use an anaphylaxis kit or epinephrine injection.  If you have had a severe reaction before, always carry your anaphylaxis kit with you. SEEK MEDICAL CARE IF:   None of the above helps within 2 to 3 days.  The area becomes red, warm, tender, and swollen beyond the area of the bite or sting.  You have an oral temperature above 102 F (38.9 C). SEEK IMMEDIATE MEDICAL CARE IF:  You have symptoms of an allergic reaction which are:  Wheezing.  Difficulty breathing.  Chest pain.  Lightheadedness or fainting.  Itchy, raised, red patches on the skin.  Nausea, vomiting, cramping or diarrhea. ANY OF THESE SYMPTOMS MAY REPRESENT A SERIOUS PROBLEM THAT IS AN EMERGENCY. Do not wait to see if the symptoms will go away. Get medical help right away. Call your local emergency services (911 in [place] DO NOT drive yourself to the hospital. MAKE SURE YOU:   Understand these instructions.  Will watch your condition.  Will get help right away if you are not doing well or get worse. Document Released: 01/03/2005 Document Revised: 03/28/2011 Document Reviewed: 06/20/2009 [facility] Patient Information 2015 [place], [place]. This information is not intended to replace advice given  to you by your health care provider. Make sure you discuss any questions you have with your health care provider.   Anaphylactic Reaction An anaphylactic reaction is a sudden, severe allergic reaction. It affects the whole body. It can be life threatening. You may need to stay in the hospital.  Stacy a medical bracelet or necklace that lists your allergy.  Carry your allergy kit or medicine shot to treat severe allergic reactions with you. These can save your  life.  Do not drive until medicine from your shot has worn off, unless your doctor says it is okay.  If you have hives or a rash:  Take medicine as told by your doctor.  You may take over-the-counter antihistamine medicine.  Place cold cloths on your skin. Take baths in cool water. Avoid hot baths and hot showers. GET HELP RIGHT AWAY IF:   Your mouth is puffy (swollen), or you have trouble breathing.  You start making whistling sounds when you breathe (wheezing).  You have a tight feeling in your chest or throat.  You have a rash, hives, puffiness, or itching on your body.  You throw up (vomit) or have watery poop (diarrhea).  You feel dizzy or pass out (faint).  You think you are having an allergic reaction.  You have new symptoms. This is an emergency. Use your medicine shot or allergy kit as told. Call your local emergency services (911 in [place] Even if you feel better after the shot, you need to go to the hospital emergency department. MAKE SURE YOU:   Understand these instructions.  Will watch your condition.  Will get help right away if you are not doing well or get worse. Document Released: 06/22/2007 Document Revised: 07/05/2011 Document Reviewed: 04/06/2011 [facility] Patient Information 2015 [place], [place]. This information is not intended to replace advice given to you by your health care provider. Make sure you discuss any questions you have with your health care provider.

## 2013-08-04 NOTE — ED Notes (Signed)
Advised will have to monitor pt until 6pm due to epinephrine given. Pt states she feels a lot better. Eating meal at this time. Nad. Redness to arms/chest gone.

## 2013-08-04 NOTE — ED Notes (Signed)
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

## 2013-08-04 NOTE — ED Provider Notes (Signed)
CSN: [id number]     Arrival date & time 08/04/13  1346 History   First MD Initiated Contact with Patient 08/04/13 1411     Chief Complaint  Patient presents with  . Allergic Reaction     (Consider location/radiation/quality/duration/timing/severity/associated sxs/prior Treatment) HPI This is a 43 year old female who was stung by a hornet just prior to arrival. The same was in the right upper back. 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 She began having a local reaction with redness itching and this has spread from the upper back to her shoulders and bilateral upper extremities. She feels like she is having a little scratching in her throat. She is not short of breath. She has not had any passing out. She states that she was intubated after the prior episode. She has not taken any medication prior to coming to the hospital. Past Medical History  Diagnosis Date  . Asthma    Past Surgical History  Procedure Laterality Date  . Tubal ligation    . Cholecystectomy     No family history on file. History  Substance Use Topics  . Smoking status: Current Every Day Smoker  . Smokeless tobacco: Not on file  . Alcohol Use: No   OB History   Grav Para Term Preterm Abortions TAB SAB Ect Mult Living                 Review of Systems  All other systems reviewed and are negative.     Allergies  Penicillins; Codeine; Gabapentin; and Latex  Home Medications   Prior to Admission medications   Medication Sig Start Date End Date Taking? Authorizing Provider  albuterol (PROVENTIL HFA;VENTOLIN HFA) 108 (90 BASE) MCG/ACT inhaler Inhale 2 puffs into the lungs every 6 (six) hours as needed. For wheezing   Yes Historical Provider, MD  cyclobenzaprine (FLEXERIL) 10 MG tablet Take 10 mg by mouth 3 (three) times daily as needed for muscle spasms.   Yes Historical Provider, MD  naproxen (NAPROSYN) 500 MG tablet Take 500 mg by mouth 2 (two) times daily with  a meal.   Yes Historical Provider, MD  omeprazole (PRILOSEC) 20 MG capsule Take 20 mg by mouth daily.   Yes Historical Provider, MD  traMADol (ULTRAM) 50 MG tablet Take 50 mg by mouth every 6 (six) hours as needed.   Yes Historical Provider, MD   BP 134/67  Pulse 87  Temp(Src) 98.2 F (36.8 C) (Oral)  Resp 9  Ht 5\' 7"  (1.702 m)  Wt 180 lb (81.647 kg)  BMI 28.19 kg/m2  SpO2 98% Physical Exam  Nursing note and vitals reviewed. Constitutional: She is oriented to person, place, and time. She appears well-developed and well-nourished.  HENT:  Head: Normocephalic and atraumatic.  Right Ear: External ear normal.  Left Ear: External ear normal.  Nose: Nose normal.  Mouth/Throat: Oropharynx is clear and moist.  Eyes: Conjunctivae and EOM are normal. Pupils are equal, round, and reactive to light.  Neck: Normal range of motion. Neck supple.  Cardiovascular: Normal rate, regular rhythm, normal heart sounds and intact distal pulses.   Pulmonary/Chest: Effort normal and breath sounds normal.  Abdominal: Bowel sounds are normal.  Musculoskeletal: Normal range of motion.  Neurological: She is alert and oriented to person, place, and time. She has normal reflexes.  Skin: Skin is warm. There is erythema.       ED Course  Procedures (including critical care time) Labs Review Labs Reviewed -  No data to display  Imaging Review No results found.   EKG Interpretation None      MDM   Final diagnoses:  Hornet sting, accidental or unintentional, initial encounter    Patient given epi, solumedrol, benadryl, and pepcid. 2:55 PM Recheck - decreased redness and itching. Airway remains patent with good bp.   3:49 PM Patient with erythema decreased. Itching gone, throat scratchiness gone.  Sting site right upper back with decreased redness.    Plan another hour of observation and if continues to have decreased symptoms, may be discharged on benadryl and prednisone.  Patient with epi pen  out of date- will refill.   Discussed with Dr. Effie ShyWentz and he will recheck with likely discharge.   Hilario Quarryanielle S Lenyx Boody, MD 08/04/13 [phone]

## 2013-08-22 ENCOUNTER — Emergency Department (HOSPITAL_COMMUNITY)
Admission: EM | Admit: 2013-08-22 | Discharge: 2013-08-22 | Disposition: A | Discharge status: Home / Self Care | Payer: Medicaid Other | Attending: Emergency Medicine | Admitting: Emergency Medicine

## 2013-08-22 ENCOUNTER — Encounter (HOSPITAL_COMMUNITY): Payer: Self-pay | Admitting: Emergency Medicine

## 2013-08-22 ENCOUNTER — Emergency Department (HOSPITAL_COMMUNITY): Payer: Medicaid Other

## 2013-08-22 DIAGNOSIS — Z91040 Latex allergy status: Secondary | ICD-10-CM | POA: Insufficient documentation

## 2013-08-22 DIAGNOSIS — Y9389 Activity, other specified: Secondary | ICD-10-CM | POA: Diagnosis not present

## 2013-08-22 DIAGNOSIS — J45909 Unspecified asthma, uncomplicated: Secondary | ICD-10-CM | POA: Insufficient documentation

## 2013-08-22 DIAGNOSIS — R000 Tachycardia, unspecified: Secondary | ICD-10-CM | POA: Diagnosis not present

## 2013-08-22 DIAGNOSIS — E079 Disorder of thyroid, unspecified: Secondary | ICD-10-CM | POA: Insufficient documentation

## 2013-08-22 DIAGNOSIS — Z880 Allergy status to penicillin: Secondary | ICD-10-CM | POA: Insufficient documentation

## 2013-08-22 DIAGNOSIS — S6990XA Unspecified injury of unspecified wrist, hand and finger(s), initial encounter: Secondary | ICD-10-CM | POA: Diagnosis present

## 2013-08-22 DIAGNOSIS — X500XXA Overexertion from strenuous movement or load, initial encounter: Secondary | ICD-10-CM | POA: Diagnosis not present

## 2013-08-22 DIAGNOSIS — F17200 Nicotine dependence, unspecified, uncomplicated: Secondary | ICD-10-CM | POA: Insufficient documentation

## 2013-08-22 DIAGNOSIS — M25532 Pain in left wrist: Secondary | ICD-10-CM

## 2013-08-22 DIAGNOSIS — Z79899 Other long term (current) drug therapy: Secondary | ICD-10-CM | POA: Diagnosis not present

## 2013-08-22 DIAGNOSIS — Y929 Unspecified place or not applicable: Secondary | ICD-10-CM | POA: Insufficient documentation

## 2013-08-22 HISTORY — DX: Disorder of thyroid, unspecified: E07.9

## 2013-08-22 NOTE — Discharge Instructions (Signed)
Your x-ray today shows now fracture or dislocation. Follow up with Dr. Merlyn LotKuzma. Call the office today to schedule follow up

## 2013-08-22 NOTE — ED Notes (Signed)
Pt states previous workman's comp injury from April 15th. Pt states she had a brace on her arm and states the brace became twisted while getting out of her car. Bruising to the wrist noted. Pt states she has not yet had an appt made with orthopedic doctor in town yet.

## 2013-08-22 NOTE — ED Provider Notes (Signed)
CSN: [id number]     Arrival date & time 08/22/13  1218 History   First MD Initiated Contact with Patient 08/22/13 1304     Chief Complaint  Patient presents with  . Wrist Injury     (Consider location/radiation/quality/duration/timing/severity/associated sxs/prior Treatment) HPI Sheila Conway is a 43 y.o. female who presents to the ED with left wrist pain that has been ongoing since she injured the area 5 months ago. 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 She had been lifting and moving beds and started having severe pain in her left wrist. She went to an orthopedic doctor there and had MRI that showed tendon injuries. She was told she would need surgery. She has been wearing a wrist splint since then. She has moved to [place] and the [employer] told her she needed  to come to the ED to get a referral to a specialist for continued care of her injury. She is here for referral. She continues to have pain in the wrist and bruising.   Past Medical History  Diagnosis Date  . Asthma   . Thyroid disease    Past Surgical History  Procedure Laterality Date  . Tubal ligation    . Cholecystectomy     No family history on file. History  Substance Use Topics  . Smoking status: Current Every Day Smoker    Types: Cigarettes  . Smokeless tobacco: Not on file  . Alcohol Use: No   OB History   Grav Para Term Preterm Abortions TAB SAB Ect Mult Living                 Review of Systems Negative except as stated in HPI   Allergies  Penicillins; Codeine; Gabapentin; and Latex  Home Medications   Prior to Admission medications   Medication Sig Start Date End Date Taking? Authorizing Provider  albuterol (PROVENTIL HFA;VENTOLIN HFA) 108 (90 BASE) MCG/ACT inhaler Inhale 2 puffs into the lungs every 6 (six) hours as needed. For wheezing   Yes Historical Provider, MD  EPINEPHrine (EPIPEN) 0.3 mg/0.3 mL IJ SOAJ injection Inject 0.3 mLs (0.3 mg total) into the  muscle as needed. 08/04/13  Yes [name], MD  levothyroxine (SYNTHROID, LEVOTHROID) 25 MCG tablet Take 25 mcg by mouth daily before breakfast.   Yes Historical Provider, MD  omeprazole (PRILOSEC) 20 MG capsule Take 20 mg by mouth daily.   Yes Historical Provider, MD   BP 141/79  Pulse 119  Temp(Src) 98.2 F (36.8 C)  Resp 18  Ht 5\' 5"  (1.651 m)  Wt 180 lb (81.647 kg)  BMI 29.95 kg/m2  SpO2 99%  LMP 06/29/2011 Physical Exam  Constitutional: She is oriented to person, place, and time. She appears well-developed and well-nourished. No distress.  HENT:  Head: Normocephalic.  Right Ear: Tympanic membrane normal.  Left Ear: Tympanic membrane normal.  Nose: Nose normal.  Mouth/Throat: Uvula is midline, oropharynx is clear and moist and mucous membranes are normal.  Eyes: Conjunctivae and EOM are normal.  Neck: Neck supple.  Cardiovascular: Tachycardia present.   Pulmonary/Chest: Effort normal.  Musculoskeletal: She exhibits no edema.  Radial pulse strong, adequate circulation, good touch sensation. There is swelling and ecchymosis noted to the left wrist. Limited ROM due to pain.   Neurological: She is alert and oriented to person, place, and time. No sensory deficit.  Skin: Skin is warm and dry.  Psychiatric: She has a normal mood and affect. Her behavior  is normal.    ED Course  Procedures (including critical care time) Labs Review Labs Reviewed - No data to display  Imaging Review Dg Wrist Complete Left  08/22/2013   CLINICAL DATA:  Twisting injury, redness, pain  EXAM: LEFT WRIST - COMPLETE 3+ VIEW  COMPARISON:  None.  FINDINGS: Diffuse osteopenia about the wrist, suspect related to immobilization or disuse.  Normal alignment without fracture. Preserved joint spaces. No significant arthropathy. Mild soft tissue swelling. Distal radius, ulna and carpal bones are intact.  IMPRESSION: Left wrist osteopenia.  Mild soft tissue swelling  No acute osseous finding   Electronically  Signed   By: Ruel Favorsrevor  Shick M.D.   On: 08/22/2013 13:13     MDM  43 y.o. female with continued pain, swelling and bruising to the left wrist s/p workers comp injury 5 months ago. Will give referral to the hand surgeon on call for follow up. Discussed with the patient and all questioned fully answered. She voices understanding and agrees to plan.    [address]Hope MontourM Desjuan Stearns, [place] 08/23/13 [phone]

## 2013-08-22 NOTE — ED Notes (Signed)
C/o pain to left wrist, from injury April 15 th, 2015.  Rating pain 9 at this time.  Need medication refill and was told by Lawyers to come to ER.  This is workman's comp injury.  Hydroco/acetami 7.5-325 tab (out of refills).

## 2013-08-23 NOTE — ED Provider Notes (Signed)
Medical screening examination/treatment/procedure(s) were performed by non-physician practitioner and as supervising physician I was immediately available for consultation/collaboration.   EKG Interpretation None        Kristen N Ward, DO 08/23/13 0913 

## 2013-08-29 ENCOUNTER — Emergency Department (HOSPITAL_COMMUNITY)
Admission: EM | Admit: 2013-08-29 | Discharge: 2013-08-29 | Disposition: A | Discharge status: Home / Self Care | Payer: Worker's Compensation | Attending: Emergency Medicine | Admitting: Emergency Medicine

## 2013-08-29 ENCOUNTER — Encounter (HOSPITAL_COMMUNITY): Payer: Self-pay | Admitting: Emergency Medicine

## 2013-08-29 DIAGNOSIS — Z880 Allergy status to penicillin: Secondary | ICD-10-CM | POA: Insufficient documentation

## 2013-08-29 DIAGNOSIS — M25539 Pain in unspecified wrist: Secondary | ICD-10-CM | POA: Diagnosis not present

## 2013-08-29 DIAGNOSIS — F17200 Nicotine dependence, unspecified, uncomplicated: Secondary | ICD-10-CM | POA: Diagnosis not present

## 2013-08-29 DIAGNOSIS — J45909 Unspecified asthma, uncomplicated: Secondary | ICD-10-CM | POA: Insufficient documentation

## 2013-08-29 DIAGNOSIS — G8911 Acute pain due to trauma: Secondary | ICD-10-CM | POA: Diagnosis not present

## 2013-08-29 DIAGNOSIS — E079 Disorder of thyroid, unspecified: Secondary | ICD-10-CM | POA: Insufficient documentation

## 2013-08-29 DIAGNOSIS — Z91040 Latex allergy status: Secondary | ICD-10-CM | POA: Insufficient documentation

## 2013-08-29 DIAGNOSIS — M25439 Effusion, unspecified wrist: Secondary | ICD-10-CM | POA: Diagnosis not present

## 2013-08-29 DIAGNOSIS — Z79899 Other long term (current) drug therapy: Secondary | ICD-10-CM | POA: Diagnosis not present

## 2013-08-29 DIAGNOSIS — M79642 Pain in left hand: Secondary | ICD-10-CM

## 2013-08-29 MED ORDER — OXYCODONE-ACETAMINOPHEN 5-325 MG PO TABS
1.0000 | ORAL_TABLET | Freq: Once | ORAL | Status: AC
  Administered 2013-08-29: 1 via ORAL
  Filled 2013-08-29: qty 1

## 2013-08-29 MED ORDER — OXYCODONE-ACETAMINOPHEN 5-325 MG PO TABS: 1.0000 | ORAL_TABLET | Freq: Four times a day (QID) | ORAL | Status: DC | PRN

## 2013-08-29 NOTE — ED Notes (Signed)
Patient states she was seen last week for increased pain to left wrist/forearm. Patient had injury April 2015.  Patient had xray last week and states it was negative. Patient also states she had MRI in June that showed tendon and ligament injury. Patient states she was placed in thumb spica splint.  Upon arrival today, patients left wrist is swollen with a purple color and has limited range of motion. Capillary refill is brisk, with +pulse to affected extremity.

## 2013-08-29 NOTE — ED Notes (Signed)
Patient verbalizes understanding of discharge instructions, prescription medications, and follow up care. Patient ambulatory out of department at this time with spouse.

## 2013-08-29 NOTE — Discharge Instructions (Signed)
Follow up tomorrow here for ultrasound.   Follow up with DR. Merlyn LotKuzma for further care on your hand

## 2013-08-29 NOTE — ED Notes (Addendum)
Pt reports past injury from last April. Pt reports ripped the ligament in half in injury from April. Pt reports followed up with ortho and was given a brace for Left wrist. Pt reports increased pain since last week. Pt reports continued pain and reports discoloration from brace. Pt reports removed brace this am and noticed a Purple color noted to medial side of left wrist since this am. Mild swelling noted to left hand as well. Distal pulses strong intact. Cap refill >3secs. Pt denies any new/recent injury.

## 2013-08-29 NOTE — ED Provider Notes (Signed)
CSN: [id number]     Arrival date & time 08/29/13  1843 History   First MD Initiated Contact with Patient 08/29/13 1919 This chart was scribed for Benny Lennert, MD by Gwenevere Abbot, ED scribe. This patient was seen in room APA17/APA17 and the patient's care was started at 7:40 PM.      Chief Complaint  Patient presents with  . Extremity Pain   Chief Complaint  Patient presents with  . Extremity Pain    Patient is a 43 y.o. female presenting with extremity pain. The history is provided by the patient. No language interpreter was used.  Extremity Pain This is a chronic problem. The current episode started more than 1 week ago. The problem occurs constantly. The problem has been gradually worsening. Pertinent negatives include no chest pain, no abdominal pain and no headaches.   HPI Comments:  Sheila Conway is a 43 y.o. female who presents to the Emergency Department complaining of a left wrist injury that originally occurred on 04/2013. 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  Pt states that she was seen by a physician in [place]. A MRI was performed in [place], [place]. The pt reports that she has torn ligaments and tendons in the left wrist. Pt states that she visited ED last week and was advised that if things worsened, to return. Pt states that injury is worsening and is turning red, purple, and is swelling, onset 1 week.  Past Medical History  Diagnosis Date  . Asthma   . Thyroid disease    Past Surgical History  Procedure Laterality Date  . Tubal ligation    . Cholecystectomy     History reviewed. No pertinent family history. History  Substance Use Topics  . Smoking status: Current Every Day Smoker -- 1.00 packs/day    Types: Cigarettes  . Smokeless tobacco: Not on file  . Alcohol Use: No   OB History   Grav Para Term Preterm Abortions TAB SAB Ect Mult Living                 Review of Systems  Constitutional: Negative for  appetite change and fatigue.  HENT: Negative for congestion, ear discharge and sinus pressure.   Eyes: Negative for discharge.  Respiratory: Negative for cough.   Cardiovascular: Negative for chest pain.  Gastrointestinal: Negative for abdominal pain and diarrhea.  Genitourinary: Negative for frequency and hematuria.  Musculoskeletal: Positive for arthralgias and myalgias. Negative for back pain.  Skin: Negative for rash.  Neurological: Negative for seizures and headaches.  Psychiatric/Behavioral: Negative for hallucinations.      Allergies  Penicillins; Codeine; Gabapentin; and Latex  Home Medications   Prior to Admission medications   Medication Sig Start Date End Date Taking? Authorizing Provider  albuterol (PROVENTIL HFA;VENTOLIN HFA) 108 (90 BASE) MCG/ACT inhaler Inhale 2 puffs into the lungs every 6 (six) hours as needed. For wheezing    Historical Provider, MD  EPINEPHrine (EPIPEN) 0.3 mg/0.3 mL IJ SOAJ injection Inject 0.3 mLs (0.3 mg total) into the muscle as needed. 08/04/13   [name], MD  levothyroxine (SYNTHROID, LEVOTHROID) 25 MCG tablet Take 25 mcg by mouth daily before breakfast.    Historical Provider, MD  omeprazole (PRILOSEC) 20 MG capsule Take 20 mg by mouth daily.    Historical Provider, MD   BP 127/75  Pulse 107  Temp(Src) 98.6 F (37 C) (Oral)  Resp 18  Ht 5\' 5"  (1.651 m)  Wt 180 lb (81.647 kg)  BMI 29.95 kg/m2  SpO2 99%  LMP 06/29/2011 Physical Exam  Constitutional: She is oriented to person, place, and time. She appears well-developed.  HENT:  Head: Normocephalic.  Eyes: Conjunctivae and EOM are normal. No scleral icterus.  Neck: Neck supple. No tracheal deviation present. No thyromegaly present.  Cardiovascular: Normal rate and regular rhythm.  Exam reveals no gallop and no friction rub.   No murmur heard. Pulmonary/Chest: No stridor. She has no wheezes. She has no rales. She exhibits no tenderness.  Abdominal: She exhibits no distension.  There is no tenderness. There is no rebound.  Musculoskeletal: Normal range of motion. She exhibits tenderness. She exhibits no edema.  Pt has swelling and mild tenderness to the left wrist. Good pulses.   Lymphadenopathy:    She has no cervical adenopathy.  Neurological: She is oriented to person, place, and time. She exhibits normal muscle tone. Coordination normal.  Skin: Skin is warm. No rash noted. No erythema.  Psychiatric: She has a normal mood and affect. Her behavior is normal.    ED Course  Procedures  DIAGNOSTIC STUDIES: Oxygen Saturation is 99% on RA, normal by my interpretation.  COORDINATION OF CARE: 7:46 PM-Discussed treatment plan with pt at bedside and pt agreed to plan.  Labs Review Labs Reviewed - No data to display  Imaging Review No results found.   EKG Interpretation None      MDM   Final diagnoses:  None   Swelling and pain left hand after waring splint for long periods of time.  Will get us to look for clots and refer to hand  The chart was scribed for me under my direct supervision.  I personally performed the history, physical, and medical decision making and all procedures in the evaluation of this patient.Benny Lennert.      Kiana Hollar L Raysa Bosak, MD 08/29/13 [phone]

## 2013-08-30 ENCOUNTER — Other Ambulatory Visit (HOSPITAL_COMMUNITY): Payer: Self-pay | Admitting: Emergency Medicine

## 2013-08-30 ENCOUNTER — Ambulatory Visit (HOSPITAL_COMMUNITY)
Admit: 2013-08-30 | Discharge: 2013-08-30 | Disposition: A | Discharge status: Home / Self Care | Payer: Medicaid Other | Attending: Emergency Medicine | Admitting: Emergency Medicine

## 2013-08-30 DIAGNOSIS — M7989 Other specified soft tissue disorders: Secondary | ICD-10-CM

## 2013-08-30 NOTE — ED Provider Notes (Signed)
DVT in left lower extremity negative.  Discussed with patient and husband  Sheila HutchingBrian Ezmeralda Stefanick, MD 08/30/13 1510

## 2013-10-12 ENCOUNTER — Emergency Department (HOSPITAL_COMMUNITY)
Admission: EM | Admit: 2013-10-12 | Discharge: 2013-10-12 | Disposition: A | Discharge status: Home / Self Care | Payer: Medicaid Other | Attending: Emergency Medicine | Admitting: Emergency Medicine

## 2013-10-12 ENCOUNTER — Encounter (HOSPITAL_COMMUNITY): Payer: Self-pay | Admitting: Emergency Medicine

## 2013-10-12 DIAGNOSIS — R059 Cough, unspecified: Secondary | ICD-10-CM | POA: Diagnosis present

## 2013-10-12 DIAGNOSIS — Z91040 Latex allergy status: Secondary | ICD-10-CM | POA: Diagnosis not present

## 2013-10-12 DIAGNOSIS — J40 Bronchitis, not specified as acute or chronic: Secondary | ICD-10-CM

## 2013-10-12 DIAGNOSIS — F17200 Nicotine dependence, unspecified, uncomplicated: Secondary | ICD-10-CM | POA: Insufficient documentation

## 2013-10-12 DIAGNOSIS — R05 Cough: Secondary | ICD-10-CM | POA: Insufficient documentation

## 2013-10-12 DIAGNOSIS — Z79899 Other long term (current) drug therapy: Secondary | ICD-10-CM | POA: Insufficient documentation

## 2013-10-12 DIAGNOSIS — IMO0001 Reserved for inherently not codable concepts without codable children: Secondary | ICD-10-CM | POA: Diagnosis not present

## 2013-10-12 DIAGNOSIS — E079 Disorder of thyroid, unspecified: Secondary | ICD-10-CM | POA: Insufficient documentation

## 2013-10-12 DIAGNOSIS — Z791 Long term (current) use of non-steroidal anti-inflammatories (NSAID): Secondary | ICD-10-CM | POA: Insufficient documentation

## 2013-10-12 DIAGNOSIS — J45909 Unspecified asthma, uncomplicated: Secondary | ICD-10-CM | POA: Insufficient documentation

## 2013-10-12 DIAGNOSIS — Z880 Allergy status to penicillin: Secondary | ICD-10-CM | POA: Insufficient documentation

## 2013-10-12 MED ORDER — HYDROCOD POLST-CHLORPHEN POLST 10-8 MG/5ML PO LQCR
5.0000 mL | Freq: Once | ORAL | Status: AC
  Administered 2013-10-12: 5 mL via ORAL
  Filled 2013-10-12: qty 5

## 2013-10-12 MED ORDER — CLARITHROMYCIN 500 MG PO TABS: 500.0000 mg | ORAL_TABLET | Freq: Two times a day (BID) | ORAL | Status: DC

## 2013-10-12 MED ORDER — CLARITHROMYCIN 500 MG PO TABS
250.0000 mg | ORAL_TABLET | Freq: Once | ORAL | Status: AC
  Administered 2013-10-12: 250 mg via ORAL
  Filled 2013-10-12: qty 1

## 2013-10-12 MED ORDER — PREDNISONE 10 MG PO TABS: ORAL_TABLET | ORAL | Status: DC

## 2013-10-12 MED ORDER — HYDROCODONE-ACETAMINOPHEN 5-325 MG PO TABS: ORAL_TABLET | ORAL | Status: DC

## 2013-10-12 NOTE — ED Notes (Signed)
Patient with no complaints at this time. Respirations even and unlabored. Skin warm/dry. Discharge instructions reviewed with patient at this time. Patient given opportunity to voice concerns/ask questions. Patient discharged at this time and left Emergency Department with steady gait.   

## 2013-10-12 NOTE — ED Notes (Signed)
Pt states she has had a non-productive cough for the past 4 weeks. She has been taking cough syrup and cough suppressants.  She is sore in her sides and feels like it is hard to breathe.

## 2013-10-14 NOTE — ED Provider Notes (Signed)
CSN: [id number]     Arrival date & time 10/12/13  1723 History   First MD Initiated Contact with Patient 10/12/13 1735     Chief Complaint  Patient presents with  . Cough     (Consider location/radiation/quality/duration/timing/severity/associated sxs/prior Treatment) HPI  Sheila Conway is a 43 y.o. female who presents to the Emergency Department complaining of cough and congestion for three to four weeks.  She states the cough is occasionally productive of yellow sputum and worse with speaking for long periods of time or when lying down.  She also c/o nasal congestion and "soreness" to her lower ribs with coughing.  She states that at times, she coughs so forcefully it makes breathing difficult.  She reports fever at onset of the symptoms , but none recently.  She has used over the counter cold and cough medications without relief.  She also states that she has similar symptoms like this every fall.  She denies bloody mucus, persistent shortness of breath, vomiting, or chills.  Past Medical History  Diagnosis Date  . Asthma   . Thyroid disease    Past Surgical History  Procedure Laterality Date  . Tubal ligation    . Cholecystectomy     History reviewed. No pertinent family history. History  Substance Use Topics  . Smoking status: Current Every Day Smoker -- 1.00 packs/day    Types: Cigarettes  . Smokeless tobacco: Not on file  . Alcohol Use: No   OB History   Grav Para Term Preterm Abortions TAB SAB Ect Mult Living                 Review of Systems  Constitutional: Positive for fever. Negative for chills, activity change and appetite change.  HENT: Positive for congestion, rhinorrhea and sore throat. Negative for facial swelling and trouble swallowing.   Eyes: Negative for visual disturbance.  Respiratory: Positive for cough and chest tightness. Negative for shortness of breath, wheezing and stridor.   Cardiovascular: Negative for chest pain.  Gastrointestinal: Negative  for nausea, vomiting and abdominal pain.  Musculoskeletal: Positive for myalgias. Negative for neck pain and neck stiffness.  Skin: Negative.  Negative for rash.  Neurological: Negative for dizziness, weakness, numbness and headaches.  Hematological: Negative for adenopathy.  Psychiatric/Behavioral: Negative for confusion.  All other systems reviewed and are negative.     Allergies  Penicillins; Codeine; Gabapentin; and Latex  Home Medications   Prior to Admission medications   Medication Sig Start Date End Date Taking? Authorizing Provider  albuterol (PROVENTIL HFA;VENTOLIN HFA) 108 (90 BASE) MCG/ACT inhaler Inhale 2 puffs into the lungs every 6 (six) hours as needed. For wheezing   Yes Historical Provider, MD  budesonide (PULMICORT) 180 MCG/ACT inhaler Inhale 2 puffs into the lungs at bedtime.   Yes Historical Provider, MD  Cyanocobalamin (VITAMIN B 12 PO) Take 1 tablet by mouth daily.   Yes Historical Provider, MD  ibuprofen (ADVIL,MOTRIN) 200 MG tablet Take 1,000 mg by mouth every 6 (six) hours as needed for mild pain or moderate pain.    Yes Historical Provider, MD  ipratropium-albuterol (DUONEB) 0.5-2.5 (3) MG/3ML SOLN Take 3 mLs by nebulization every 4 (four) hours as needed.   Yes Historical Provider, MD  levothyroxine (SYNTHROID, LEVOTHROID) 25 MCG tablet Take 37.5 mcg by mouth daily before breakfast.   Yes Historical Provider, MD  omeprazole (PRILOSEC) 20 MG capsule Take 20 mg by mouth 2 (two) times daily before a meal.    Yes Historical Provider, MD  tiotropium (SPIRIVA) 18 MCG inhalation capsule Place 18 mcg into inhaler and inhale at bedtime.   Yes Historical Provider, MD  clarithromycin (BIAXIN) 500 MG tablet Take 1 tablet (500 mg total) by mouth 2 (two) times daily. For 10 days 10/12/13   Favor Kreh L. Brinley Rosete, PA-C  EPINEPHrine (EPIPEN) 0.3 mg/0.3 mL IJ SOAJ injection Inject 0.3 mLs (0.3 mg total) into the muscle as needed. 08/04/13   [name], MD   HYDROcodone-acetaminophen (NORCO/VICODIN) 5-325 MG per tablet Take one-two tabs po q 4-6 hrs prn pain 10/12/13   Magdeline Prange L. Adisyn Ruscitti, PA-C  predniSONE (DELTASONE) 10 MG tablet Take 6 tablets day one, 5 tablets day two, 4 tablets day three, 3 tablets day four, 2 tablets day five, then 1 tablet day six 10/12/13   Lauria Depoy L. Steffany Schoenfelder, PA-C   BP 121/75  Pulse 93  Temp(Src) 98.3 F (36.8 C) (Oral)  Resp 18  Ht  (1.651 m)  Wt 180 lb (81.647 kg)  BMI 29.95 kg/m2  SpO2 99%  LMP 06/29/2011 Physical Exam  Nursing note and vitals reviewed. Constitutional: She is oriented to person, place, and time. She appears well-developed and well-nourished. No distress.  HENT:  Head: Normocephalic and atraumatic.  Right Ear: Tympanic membrane and ear canal normal.  Left Ear: Tympanic membrane and ear canal normal.  Nose: Mucosal edema present.  Mouth/Throat: Uvula is midline and mucous membranes are normal. Posterior oropharyngeal erythema present. No oropharyngeal exudate, posterior oropharyngeal edema or tonsillar abscesses.  Eyes: EOM are normal. Pupils are equal, round, and reactive to light.  Neck: Normal range of motion, full passive range of motion without pain and phonation normal. Neck supple.  Cardiovascular: Normal rate, regular rhythm, normal heart sounds and intact distal pulses.   No murmur heard. Pulmonary/Chest: Effort normal. No stridor. No respiratory distress. She has no rales. She exhibits no tenderness.  Coarse lungs sounds bilaterally.  No rales,  or wheezing  Musculoskeletal: Normal range of motion. She exhibits no edema.  Lymphadenopathy:    She has no cervical adenopathy.  Neurological: She is alert and oriented to person, place, and time. She exhibits normal muscle tone. Coordination normal.  Skin: Skin is warm and dry.    ED Course  Procedures (including critical care time) Labs Review Labs Reviewed - No data to display  Imaging Review No results found.   EKG  Interpretation None      MDM   Final diagnoses:  Bronchitis    Pt is well appearing.  No tachycardia or hypoxia.  Reports having shortness of breath only with episodes of forceful coughing and similar symptoms at this time of year.  Clinical suspicion for PE is low.  Patient agrees to antibiotics, prednisone and has inhaler at home.  I have advised close f/u with her PMD or to return here in 1-2 days if the sx's worsen or not improving.  Pt agrees to plan and appears stable for d/c    Miray Mancino L. Stalin Gruenberg, PA-C 10/14/13 1321

## 2013-10-15 NOTE — ED Provider Notes (Signed)
Medical screening examination/treatment/procedure(s) were performed by non-physician practitioner and as supervising physician I was immediately available for consultation/collaboration.   EKG Interpretation None       Donnetta HutchingBrian Janaysha Depaulo, MD 10/15/13 [phone]

## 2013-10-24 ENCOUNTER — Other Ambulatory Visit (HOSPITAL_COMMUNITY): Payer: Self-pay | Admitting: Orthopedic Surgery

## 2013-10-24 DIAGNOSIS — IMO0002 Reserved for concepts with insufficient information to code with codable children: Secondary | ICD-10-CM

## 2013-10-30 ENCOUNTER — Encounter (HOSPITAL_COMMUNITY)
Admission: RE | Admit: 2013-10-30 | Discharge: 2013-10-30 | Disposition: A | Discharge status: Home / Self Care | Payer: Medicaid Other | Source: Ambulatory Visit | Attending: Orthopedic Surgery | Admitting: Orthopedic Surgery

## 2013-10-30 ENCOUNTER — Encounter (HOSPITAL_COMMUNITY): Payer: Medicaid Other

## 2013-10-30 DIAGNOSIS — IMO0002 Reserved for concepts with insufficient information to code with codable children: Secondary | ICD-10-CM

## 2013-10-30 DIAGNOSIS — G9050 Complex regional pain syndrome I, unspecified: Secondary | ICD-10-CM | POA: Insufficient documentation

## 2013-10-30 MED ORDER — TECHNETIUM TC 99M MEDRONATE IV KIT
25.0000 | PACK | Freq: Once | INTRAVENOUS | Status: AC | PRN
  Administered 2013-10-30: 25 via INTRAVENOUS

## 2014-01-31 ENCOUNTER — Emergency Department (HOSPITAL_COMMUNITY): Payer: Medicaid Other

## 2014-01-31 ENCOUNTER — Encounter (HOSPITAL_COMMUNITY): Payer: Self-pay | Admitting: *Deleted

## 2014-01-31 ENCOUNTER — Emergency Department (HOSPITAL_COMMUNITY)
Admission: EM | Admit: 2014-01-31 | Discharge: 2014-01-31 | Disposition: A | Discharge status: Home / Self Care | Payer: Medicaid Other | Attending: Emergency Medicine | Admitting: Emergency Medicine

## 2014-01-31 DIAGNOSIS — Z880 Allergy status to penicillin: Secondary | ICD-10-CM | POA: Insufficient documentation

## 2014-01-31 DIAGNOSIS — Z91040 Latex allergy status: Secondary | ICD-10-CM | POA: Diagnosis not present

## 2014-01-31 DIAGNOSIS — Z7952 Long term (current) use of systemic steroids: Secondary | ICD-10-CM | POA: Diagnosis not present

## 2014-01-31 DIAGNOSIS — Z792 Long term (current) use of antibiotics: Secondary | ICD-10-CM | POA: Diagnosis not present

## 2014-01-31 DIAGNOSIS — E079 Disorder of thyroid, unspecified: Secondary | ICD-10-CM | POA: Insufficient documentation

## 2014-01-31 DIAGNOSIS — J029 Acute pharyngitis, unspecified: Secondary | ICD-10-CM | POA: Diagnosis present

## 2014-01-31 DIAGNOSIS — J45909 Unspecified asthma, uncomplicated: Secondary | ICD-10-CM | POA: Diagnosis not present

## 2014-01-31 DIAGNOSIS — R0989 Other specified symptoms and signs involving the circulatory and respiratory systems: Secondary | ICD-10-CM | POA: Diagnosis not present

## 2014-01-31 DIAGNOSIS — Z79899 Other long term (current) drug therapy: Secondary | ICD-10-CM | POA: Insufficient documentation

## 2014-01-31 DIAGNOSIS — J40 Bronchitis, not specified as acute or chronic: Secondary | ICD-10-CM

## 2014-01-31 MED ORDER — HYDROCODONE-ACETAMINOPHEN 5-325 MG PO TABS: 1.0000 | ORAL_TABLET | Freq: Four times a day (QID) | ORAL | Status: DC | PRN

## 2014-01-31 MED ORDER — HYDROCOD POLST-CHLORPHEN POLST 10-8 MG/5ML PO LQCR
5.0000 mL | Freq: Once | ORAL | Status: AC
  Administered 2014-01-31: 5 mL via ORAL
  Filled 2014-01-31: qty 5

## 2014-01-31 MED ORDER — PREDNISONE 10 MG PO TABS: 20.0000 mg | ORAL_TABLET | Freq: Two times a day (BID) | ORAL | Status: DC

## 2014-01-31 MED ORDER — AZITHROMYCIN 250 MG PO TABS: 250.0000 mg | ORAL_TABLET | Freq: Every day | ORAL | Status: DC

## 2014-01-31 MED ORDER — IPRATROPIUM-ALBUTEROL 0.5-2.5 (3) MG/3ML IN SOLN
3.0000 mL | Freq: Once | RESPIRATORY_TRACT | Status: AC
  Administered 2014-01-31: 3 mL via RESPIRATORY_TRACT
  Filled 2014-01-31: qty 3

## 2014-01-31 NOTE — ED Notes (Signed)
Pt co sore throat, upper respiratory congestion x 3 weeks with no improvement.

## 2014-01-31 NOTE — ED Notes (Signed)
[facility]

## 2014-01-31 NOTE — ED Notes (Signed)
Cough, sore throat  For 3 weeks with nasal congestion Green mucus from nose, cough is nonproductive.  Throat is red and tongue is red and dry.

## 2014-01-31 NOTE — ED Provider Notes (Signed)
CSN: [id number]     Arrival date & time 01/31/14  1439 History   First MD Initiated Contact with Patient 01/31/14 1508     Chief Complaint  Patient presents with  . Sore Throat  . Cough     (Consider location/radiation/quality/duration/timing/severity/associated sxs/prior Treatment) Patient is a 44 y.o. female presenting with pharyngitis and cough. The history is provided by the patient.  Sore Throat This is a new problem. The current episode started 1 to 4 weeks ago. The problem occurs constantly. The problem has been gradually worsening. Associated symptoms include congestion, coughing and a sore throat.  Cough Associated symptoms: ear pain, sore throat and wheezing    Rogue BussingBobbie Burridge is a 44 y.o. female who presents to the ED with ear ache, sore throat and wheezing that started 3 weeks ago and has gotten progressively worse. Patient has been taking OTC medications without relief. Patient has an Albuterol Inhaler that she uses as needed.   Past Medical History  Diagnosis Date  . Asthma   . Thyroid disease    Past Surgical History  Procedure Laterality Date  . Tubal ligation    . Cholecystectomy     History reviewed. No pertinent family history. History  Substance Use Topics  . Smoking status: Current Every Day Smoker -- 1.00 packs/day    Types: Cigarettes  . Smokeless tobacco: Not on file  . Alcohol Use: No   OB History    No data available     Review of Systems  HENT: Positive for congestion, ear pain and sore throat.   Respiratory: Positive for cough and wheezing.   all other systems negative.     Allergies  Penicillins; Codeine; Gabapentin; and Latex  Home Medications   Prior to Admission medications   Medication Sig Start Date End Date Taking? Authorizing Provider  albuterol (PROVENTIL HFA;VENTOLIN HFA) 108 (90 BASE) MCG/ACT inhaler Inhale 2 puffs into the lungs every 6 (six) hours as needed. For wheezing    Historical Provider, MD  budesonide (PULMICORT)  180 MCG/ACT inhaler Inhale 2 puffs into the lungs at bedtime.    Historical Provider, MD  clarithromycin (BIAXIN) 500 MG tablet Take 1 tablet (500 mg total) by mouth 2 (two) times daily. For 10 days 10/12/13   Tammy L. Triplett, PA-C  Cyanocobalamin (VITAMIN B 12 PO) Take 1 tablet by mouth daily.    Historical Provider, MD  EPINEPHrine (EPIPEN) 0.3 mg/0.3 mL IJ SOAJ injection Inject 0.3 mLs (0.3 mg total) into the muscle as needed. 08/04/13   Hilario Quarryanielle S Ray, MD  HYDROcodone-acetaminophen (NORCO/VICODIN) 5-325 MG per tablet Take one-two tabs po q 4-6 hrs prn pain 10/12/13   Tammy L. Triplett, PA-C  ibuprofen (ADVIL,MOTRIN) 200 MG tablet Take 1,000 mg by mouth every 6 (six) hours as needed for mild pain or moderate pain.     Historical Provider, MD  ipratropium-albuterol (DUONEB) 0.5-2.5 (3) MG/3ML SOLN Take 3 mLs by nebulization every 4 (four) hours as needed.    Historical Provider, MD  levothyroxine (SYNTHROID, LEVOTHROID) 25 MCG tablet Take 37.5 mcg by mouth daily before breakfast.    Historical Provider, MD  omeprazole (PRILOSEC) 20 MG capsule Take 20 mg by mouth 2 (two) times daily before a meal.     Historical Provider, MD  predniSONE (DELTASONE) 10 MG tablet Take 6 tablets day one, 5 tablets day two, 4 tablets day three, 3 tablets day four, 2 tablets day five, then 1 tablet day six 10/12/13   Tammy L. Triplett, PA-C  tiotropium (SPIRIVA) 18 MCG inhalation capsule Place 18 mcg into inhaler and inhale at bedtime.    Historical Provider, MD   BP 127/68 mmHg  Pulse 85  Temp(Src) 98.8 F (37.1 C) (Oral)  Resp 18  Ht  (1.676 m)  Wt 190 lb (86.183 kg)  BMI 30.68 kg/m2  SpO2 96%  LMP 06/29/2011 Physical Exam  Constitutional: She is oriented to person, place, and time. She appears well-developed and well-nourished.  HENT:  Head: Normocephalic and atraumatic.  Eyes: EOM are normal.  Neck: Neck supple.  Cardiovascular: Normal rate, regular rhythm and normal heart sounds.   Pulmonary/Chest:  Effort normal. She has decreased breath sounds. She has wheezes. She has rales in the right lower field.  Abdominal: Soft. There is no tenderness.  Musculoskeletal: Normal range of motion.  Neurological: She is alert and oriented to person, place, and time. No cranial nerve deficit.  Skin: Skin is warm and dry.  Nursing note and vitals reviewed.   ED Course  Procedures (including critical care time) Albuterol/Atrovent neb treatment administered After treatment patient states she feel a lot better. Lung sounds have improved.  Labs ReviewImaging Review  Dg Chest 2 View  01/31/2014   CLINICAL DATA:  Nonproductive cough for 3 weeks  EXAM: CHEST  2 VIEW  COMPARISON:  None.  FINDINGS: Normal mediastinum and cardiac silhouette. Normal pulmonary vasculature. No evidence of effusion, infiltrate, or pneumothorax. No acute bony abnormality.  IMPRESSION: No acute cardiopulmonary process.   Electronically Signed   By: Genevive Bi M.D.   On: 01/31/2014 16:04     MDM  44 y.o. female with cough, wheezing, sore throat and ear pain. Stable for discharge with O2 Sat 96% on R/A. She will follow up with her PCP. Will treat for bronchitis. She will return for worsening symptoms. Discussed with the patient and all questioned fully answered.       [address] [place], NP 02/02/14 0006  Vanetta Mulders, MD 02/02/14 [phone]

## 2014-03-09 ENCOUNTER — Encounter (HOSPITAL_COMMUNITY): Payer: Self-pay

## 2014-03-09 ENCOUNTER — Emergency Department (HOSPITAL_COMMUNITY)
Admission: EM | Admit: 2014-03-09 | Discharge: 2014-03-09 | Disposition: A | Discharge status: Home / Self Care | Payer: Medicaid Other | Attending: Emergency Medicine | Admitting: Emergency Medicine

## 2014-03-09 DIAGNOSIS — Z880 Allergy status to penicillin: Secondary | ICD-10-CM | POA: Insufficient documentation

## 2014-03-09 DIAGNOSIS — Z91040 Latex allergy status: Secondary | ICD-10-CM | POA: Insufficient documentation

## 2014-03-09 DIAGNOSIS — J209 Acute bronchitis, unspecified: Secondary | ICD-10-CM | POA: Insufficient documentation

## 2014-03-09 DIAGNOSIS — E079 Disorder of thyroid, unspecified: Secondary | ICD-10-CM | POA: Insufficient documentation

## 2014-03-09 DIAGNOSIS — Z79899 Other long term (current) drug therapy: Secondary | ICD-10-CM | POA: Diagnosis not present

## 2014-03-09 DIAGNOSIS — J45901 Unspecified asthma with (acute) exacerbation: Secondary | ICD-10-CM | POA: Diagnosis not present

## 2014-03-09 DIAGNOSIS — J40 Bronchitis, not specified as acute or chronic: Secondary | ICD-10-CM

## 2014-03-09 DIAGNOSIS — Z720 Tobacco use: Secondary | ICD-10-CM | POA: Insufficient documentation

## 2014-03-09 DIAGNOSIS — R05 Cough: Secondary | ICD-10-CM | POA: Diagnosis present

## 2014-03-09 HISTORY — DX: Bronchitis, not specified as acute or chronic: J40

## 2014-03-09 MED ORDER — DOXYCYCLINE HYCLATE 100 MG PO CAPS: 100.0000 mg | ORAL_CAPSULE | Freq: Two times a day (BID) | ORAL | Status: DC

## 2014-03-09 MED ORDER — HYDROCOD POLST-CHLORPHEN POLST 10-8 MG/5ML PO LQCR: 5.0000 mL | Freq: Two times a day (BID) | ORAL | Status: DC

## 2014-03-09 MED ORDER — DOXYCYCLINE HYCLATE 100 MG PO TABS
100.0000 mg | ORAL_TABLET | Freq: Once | ORAL | Status: AC
  Administered 2014-03-09: 100 mg via ORAL
  Filled 2014-03-09: qty 1

## 2014-03-09 MED ORDER — PREDNISONE 10 MG PO TABS: ORAL_TABLET | ORAL | Status: DC

## 2014-03-09 MED ORDER — HYDROCOD POLST-CHLORPHEN POLST 10-8 MG/5ML PO LQCR
5.0000 mL | Freq: Once | ORAL | Status: AC
  Administered 2014-03-09: 5 mL via ORAL
  Filled 2014-03-09: qty 5

## 2014-03-09 MED ORDER — PREDNISONE 10 MG PO TABS
60.0000 mg | ORAL_TABLET | Freq: Once | ORAL | Status: AC
  Administered 2014-03-09: 60 mg via ORAL
  Filled 2014-03-09 (×2): qty 1

## 2014-03-09 NOTE — ED Notes (Signed)
Patient states that she has been coughing, wheezing, and feels like something is sitting on her chest. Started two weeks ago with a cold and has progressed.

## 2014-03-10 NOTE — ED Provider Notes (Signed)
CSN: [id number]     Arrival date & time 03/09/14  1934 History   First MD Initiated Contact with Patient 03/09/14 1946     Chief Complaint  Patient presents with  . Bronchitis     (Consider location/radiation/quality/duration/timing/severity/associated sxs/prior Treatment) HPI  Sheila Conway is a 44 y.o. female who presents to the Emergency Department complaining of cough, wheezing, and chest tightness for two weeks.  She states that she has bronchitis frequently and symptoms feels similar.  Cough has been mostly non-productive and worse with exertion.  She also reports nasal congestion and irritation of her throat.  She has been using her albuterol inhaler and nebulizer with mild relief.  She denies fever, chest pain, nausea/vomiting, or shortness of breath.   Past Medical History  Diagnosis Date  . Asthma   . Thyroid disease   . Bronchitis    Past Surgical History  Procedure Laterality Date  . Tubal ligation    . Cholecystectomy     No family history on file. History  Substance Use Topics  . Smoking status: Current Every Day Smoker -- 1.00 packs/day    Types: Cigarettes  . Smokeless tobacco: Not on file  . Alcohol Use: No   OB History    No data available     Review of Systems  Constitutional: Negative for fever, chills and appetite change.  HENT: Positive for congestion. Negative for sore throat and trouble swallowing.   Respiratory: Positive for cough, chest tightness and wheezing. Negative for shortness of breath.   Cardiovascular: Negative for chest pain.  Gastrointestinal: Negative for nausea, vomiting and abdominal pain.  Genitourinary: Negative for dysuria.  Musculoskeletal: Negative for arthralgias.  Skin: Negative for rash.  Neurological: Negative for dizziness, weakness and numbness.  Hematological: Negative for adenopathy.  All other systems reviewed and are negative.     Allergies  Penicillins; Codeine; Gabapentin; and Latex  Home Medications    Prior to Admission medications   Medication Sig Start Date End Date Taking? Authorizing Provider  albuterol (PROVENTIL HFA;VENTOLIN HFA) 108 (90 BASE) MCG/ACT inhaler Inhale 2 puffs into the lungs every 6 (six) hours as needed. For wheezing   Yes Historical Provider, MD  EPINEPHrine (EPIPEN) 0.3 mg/0.3 mL IJ SOAJ injection Inject 0.3 mLs (0.3 mg total) into the muscle as needed. 08/04/13  Yes Hilario Quarryanielle S Ray, MD  ipratropium-albuterol (DUONEB) 0.5-2.5 (3) MG/3ML SOLN Take 3 mLs by nebulization every 4 (four) hours as needed (shortness of breath).    Yes Historical Provider, MD  oxybutynin (DITROPAN) 5 MG tablet Take 5 mg by mouth 2 (two) times daily.   Yes Historical Provider, MD  simvastatin (ZOCOR) 20 MG tablet Take 20 mg by mouth at bedtime.   Yes Historical Provider, MD  tiotropium (SPIRIVA) 18 MCG inhalation capsule Place 18 mcg into inhaler and inhale at bedtime.   Yes Historical Provider, MD  amitriptyline (ELAVIL) 25 MG tablet Take 25 mg by mouth at bedtime. 11/20/13   Historical Provider, MD  chlorpheniramine-HYDROcodone (TUSSIONEX PENNKINETIC ER) 10-8 MG/5ML LQCR Take 5 mLs by mouth 2 (two) times daily. 03/09/14   Rilee Knoll L. Brindy Higginbotham, PA-C  Cyanocobalamin (VITAMIN B 12 PO) Take 1 tablet by mouth daily.    Historical Provider, MD  doxycycline (VIBRAMYCIN) 100 MG capsule Take 1 capsule (100 mg total) by mouth 2 (two) times daily. 03/09/14   Slaton Reaser L. Tranquilino Fischler, PA-C  ibuprofen (ADVIL,MOTRIN) 200 MG tablet Take 1,000 mg by mouth every 6 (six) hours as needed for mild pain or moderate  pain.     Historical Provider, MD  levothyroxine (SYNTHROID, LEVOTHROID) 25 MCG tablet Take 37.5 mcg by mouth daily before breakfast.    Historical Provider, MD  omeprazole (PRILOSEC) 20 MG capsule Take 20 mg by mouth 2 (two) times daily before a meal.     Historical Provider, MD  predniSONE (DELTASONE) 10 MG tablet Take 6 tablets day one, 5 tablets day two, 4 tablets day three, 3 tablets day four, 2 tablets day five,  then 1 tablet day six 03/09/14   Daymein Nunnery L. Bernice Mullin, PA-C  Vitamin D, Ergocalciferol, (DRISDOL) 50000 UNITS CAPS capsule Take 50,000 Units by mouth every 7 (seven) days. monday    Historical Provider, MD   BP 120/60 mmHg  Pulse 97  Temp(Src) 97.8 F (36.6 C) (Oral)  Resp 20  Ht  (1.651 m)  Wt 172 lb (78.019 kg)  BMI 28.62 kg/m2  SpO2 99%  LMP 06/29/2011 Physical Exam  Constitutional: She is oriented to person, place, and time. She appears well-developed and well-nourished. No distress.  HENT:  Head: Normocephalic and atraumatic.  Right Ear: Tympanic membrane and ear canal normal.  Left Ear: Tympanic membrane and ear canal normal.  Mouth/Throat: Uvula is midline, oropharynx is clear and moist and mucous membranes are normal. No oropharyngeal exudate.  Eyes: EOM are normal. Pupils are equal, round, and reactive to light.  Neck: Normal range of motion, full passive range of motion without pain and phonation normal. Neck supple.  Cardiovascular: Normal rate, regular rhythm, normal heart sounds and intact distal pulses.   No murmur heard. Pulmonary/Chest: Effort normal. No stridor. No respiratory distress. She has no wheezes. She has no rales. She exhibits no tenderness.  Coarse lungs sounds bilaterally.   Musculoskeletal: Normal range of motion. She exhibits no edema.  Lymphadenopathy:    She has no cervical adenopathy.  Neurological: She is alert and oriented to person, place, and time. She exhibits normal muscle tone. Coordination normal.  Skin: Skin is warm and dry.  Nursing note and vitals reviewed.   ED Course  Procedures (including critical care time) Labs Review Labs Reviewed - No data to display  Imaging Review No results found.   EKG Interpretation None      MDM   Final diagnoses:  Bronchitis    Pt is well appearing.  Non-toxic.  Vitals stable.  No tachypnea or hypoxia. Speaking in complete sentences without difficulty.  Pt had an albuterol neb tx just  prior to ED arrival and lung sounds are clear on exam.  CXR not clinically indicated at this time. Pt agrees to continue nebs, prednisone taper, doxycyline, and  Tussionex.  Advised to close f/u with her PMD or to return here if needed    Mailey Landstrom L. Trisha Mangle, PA-C 03/10/14 2130  Benny Lennert, MD 03/11/14 [phone]

## 2014-03-18 ENCOUNTER — Ambulatory Visit (HOSPITAL_COMMUNITY): Payer: Medicaid Other | Admitting: Specialist

## 2014-03-19 ENCOUNTER — Encounter (HOSPITAL_COMMUNITY): Payer: Self-pay | Admitting: Specialist

## 2014-03-19 ENCOUNTER — Ambulatory Visit (HOSPITAL_COMMUNITY): Payer: Worker's Compensation | Attending: Pain Medicine | Admitting: Specialist

## 2014-03-19 DIAGNOSIS — M6281 Muscle weakness (generalized): Secondary | ICD-10-CM | POA: Insufficient documentation

## 2014-03-19 DIAGNOSIS — M25642 Stiffness of left hand, not elsewhere classified: Secondary | ICD-10-CM | POA: Insufficient documentation

## 2014-03-19 DIAGNOSIS — R200 Anesthesia of skin: Secondary | ICD-10-CM | POA: Diagnosis not present

## 2014-03-19 DIAGNOSIS — M25532 Pain in left wrist: Secondary | ICD-10-CM | POA: Insufficient documentation

## 2014-03-19 DIAGNOSIS — G90512 Complex regional pain syndrome I of left upper limb: Secondary | ICD-10-CM

## 2014-03-19 DIAGNOSIS — R209 Unspecified disturbances of skin sensation: Secondary | ICD-10-CM

## 2014-03-19 DIAGNOSIS — M2560 Stiffness of unspecified joint, not elsewhere classified: Secondary | ICD-10-CM

## 2014-03-19 DIAGNOSIS — R29898 Other symptoms and signs involving the musculoskeletal system: Secondary | ICD-10-CM

## 2014-03-19 DIAGNOSIS — M79642 Pain in left hand: Secondary | ICD-10-CM | POA: Diagnosis not present

## 2014-03-19 DIAGNOSIS — R208 Other disturbances of skin sensation: Secondary | ICD-10-CM

## 2014-03-19 NOTE — Therapy (Signed)
[facility] [address] [place], [place], [address] Phone: [phone]   Fax:  [phone]  Occupational Therapy Evaluation  Patient Details  Name: Sheila Conway MRN: [id number] Date of Birth: Dec 05, 1970 Referring Provider:  Oneita Kras, MD  Encounter Date: 03/19/2014      OT End of Session - 03/19/14 2117    OT Start Time 1438   OT Stop Time 1520   OT Time Calculation (min) 42 min   Activity Tolerance Patient tolerated treatment well   Behavior During Therapy [facility] for tasks assessed/performed      Past Medical History  Diagnosis Date  . Asthma   . Thyroid disease   . Bronchitis     Past Surgical History  Procedure Laterality Date  . Tubal ligation    . Cholecystectomy      LMP 06/29/2011  Visit Diagnosis:  Left hand pain - Plan: Ot plan of care cert/re-cert  Complex regional pain syndrome type 1 affecting left hand - Plan: Ot plan of care cert/re-cert  Wrist pain, left - Plan: Ot plan of care cert/re-cert  Limited joint range of motion - Plan: Ot plan of care cert/re-cert  Decreased grip strength of left hand - Plan: Ot plan of care cert/re-cert  Loss of touch sensation on examination - Plan: Ot plan of care cert/re-cert      Subjective Assessment - 03/19/14 2118    Symptoms S:  I want to be able to move my thumb again.   Pertinent History Mrs. 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  The medical attention she recieved placed her left wrist and thumb in a cast for 7 months, and then an additional 3 months, with the last cast being removed in November.  She is being treated by Dr. Yolanda Bonine for pain control and has been diagnosed with Complex Regional Pain Syndrome Type 1.  She is unable to bend her wrist or her left thumb.  She has been referred to occupational therapy for evaluation and  treatment.    Patient Stated Goals I want to use my left hand and thumb again   Currently in Pain? Yes   Pain Score 9    Pain Location Wrist  thumb   Pain Orientation Left   Pain Descriptors / Indicators Burning;Constant   Pain Type Neuropathic pain   Pain Onset More than a month ago   Pain Frequency Constant   Pain Relieving Factors ice   Effect of Pain on Daily Activities unable to use her left hand with functional actiivties          [facility] OT Assessment - 03/19/14 0001    Assessment   Diagnosis Complex Regional Pain Syndrome Type 1 Left hand   Onset Date 05/01/13   Precautions   Precautions None   Restrictions   Weight Bearing Restrictions No   Balance Screen   Has the patient fallen in the past 6 months No   Has the patient had a decrease in activity level because of a fear of falling?  No   Is the patient reluctant to leave their home because of a fear of falling?  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  Was left hand dominant and is unable to use her left hand with any functional activities.  she can manipulate some items with her left digits.  She is unable to cook, clean.  she is not able to fasten zippers, buttons, jewelery, bra, put her hair in pony tail.   Written Expression   Dominant Hand Left   Handwriting --  now writes with her right hand   Vision - History   Baseline Vision No visual deficits   Cognition   Overall Cognitive Status Within Functional Limits for tasks assessed   Observation/Other Assessments   Skin Integrity rigid nails, redness in thumb and wrist, dry scaly skin   Outcome Measures clinical observation - see below   Sensation   Light Touch  Impaired Detail   Semmes Weinstein Monofilament Scale Unresponsive  left thumb and thenar eminence, other 3.61   Coordination   Fine Motor Movements are Fluid and Coordinated No   Coordination and Movement Description unable to oppose her thumb to her index finger, unable to use pincer grasp, lateral pinch, uses index finger to thenar eminence to manipulate objects with her left hand.  web space is 1" vs 2.25" on right   9 Hole Peg Test --  unable to complete   Edema   Edema edema assessment detectes muscle atrophy:  wrist crease right 16.0 cm left 15.0 cm, PIPJ of thumb right 6.5 cm left 5.8 cm   Palpation   Palpation max fascial restrictions and stiffness in left forearm, wrist and hand   Left Hand AROM   L Thumb MCP 0-60 0 Degrees   L Thumb IP 0-80 4 Degrees   Left Hand PROM   L Thumb MCP 0-60 0 Degrees   L Thumb IP 0-80 6 Degrees  thumb  extension WFL - fixed in extension   Hand Function   Right Hand Grip (lbs) 92   Right Hand Lateral Pinch 18 lbs   Left Hand Grip (lbs) 22   Left Hand Lateral Pinch 0 lbs                       OT Education - 03/19/14 1650    Education provided Yes   Education Details wrist AROM, palm hollowing, crumple paper, towel squeeze   Person(s) Educated Patient   Methods Explanation;Demonstration;Handout   Comprehension Verbalized understanding          OT Short Term Goals - 03/19/14 2139    OT SHORT TERM GOAL #1   Title Patient will be educated on HEP.   Time 6   Period Weeks   Status New   OT SHORT TERM GOAL #2   Title Patient will improve PROM of left wrist and thumb by 20 degrees for increased ability to use left hand as a gross assist with daily tasks.    Time 6   Period Weeks   Status New   OT SHORT TERM GOAL #3   Title Patient will improve left wrist strength to 3+/5, left grip strength by 10 pounds, and lateral pinch strength by 2 pounds in order to impove ability to grip objects.   Time 6   Period Weeks   Status  New   OT SHORT TERM GOAL #4   Title Patient will  improve left hand coordination demonstrated by being able to oppose thumb to index finger and increasing left webspace by .25 cm.   Time 6   Period Weeks   OT SHORT TERM GOAL #5   Title Patient will improve left thumb sensation to loss of protective sensation for increased safety.   Time 6   Period Weeks   Status New   Additional Short Term Goals   Additional Short Term Goals Yes   OT SHORT TERM GOAL #6   Title Patient will decrease pain level to 7/10 or better in her left thumb and wrist.   Time 12   Period Weeks   Status New           OT Long Term Goals - 03/19/14 2128    OT LONG TERM GOAL #1   Title Patient will use her left hand as an active assist with all functional activities, BADLs, work, and leisure activities.   Time 12   Period Weeks   Status New   OT LONG TERM GOAL #2   Title Patient will improve PROM of left wrist and thumb by to [facility], and demonstrated 25% AROM  for increased ability to use left hand as an active assist with daily tasks.    Time 12   Period Weeks   Status New   OT LONG TERM GOAL #3   Title Patient will improve left wrist strength to 4-/5, left grip strength by 25 pounds, and lateral pinch strength by 5 pounds in order to impove ability to open containers and place hair in a pony tail independently.   Time 12   Period Weeks   Status New   OT LONG TERM GOAL #4   Title Patient will increase left hand coordination in order to fasten her pants and tie her shoes, as demonstrated by a functional pincer grasp and being able to oppose to her ring finger.   Time 12   Period Weeks   OT LONG TERM GOAL #5   Title Patient will improve sensation to diminshed light touch in her left thumb for increased safety when cooking.    Time 12   Period Weeks   Status New   Long Term Additional Goals   Additional Long Term Goals Yes   OT LONG TERM GOAL #6   Title Patient will decrease pain in her left wrist and hand  to 5/10 or better.   Time 12   Period Weeks   Status New   OT LONG TERM GOAL #7   Title Patient will decrease fascial restrictions to min-mod.   Time 12   Period Weeks   Status New               Plan - 03/19/14 2119    Clinical Impression Statement A:  Mrs. 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  The medical attention she recieved placed her left wrist and thumb in a cast for 7 months, and then an additional 3 months, with the last cast being removed in November.  She is being treated by Dr. Yolanda Bonine for pain control and has been diagnosed with Complex Regional Pain Syndrome Type 1.  She is unable to bend her wrist or her left thumb.  She has been referred to occupational therapy for evaluation and treatment.    Pt will benefit from skilled  therapeutic intervention in order to improve on the following deficits (Retired) Decreased coordination;Decreased strength;Decreased skin integrity;Decreased range of motion;Impaired sensation;Increased fascial restricitons;Pain;Impaired UE functional use   Rehab Potential Fair   OT Frequency 3x / week   OT Duration 12 weeks   OT Treatment/Interventions Self-care/ADL training;Contrast Bath;Therapeutic exercise;Neuromuscular education;Manual Therapy;Splinting;Therapeutic exercises;Therapeutic activities;Patient/family education  modalities as needed   Plan P:  Skilled OT intervention to decrease pain, stiffness, tightness, and improve AROM, strength, coordination, sensation in left wrist and thumb in order for patient to return to use of left and as dominantwith daily tasks.  Next visit plan::  follow up on HEP, begin improving mobility of left thumb and wrist and exercises foused on CRPS.     Consulted and Agree with Plan of Care Patient        Problem List There are no active problems to display for this  patient.   Shirlean MylarBethany H. Yalonda Sample, OTR/L [phone]  03/19/2014, 9:42 PM  [facility] [address] [place], [place], [address] Phone: [phone]   Fax:  [phone]

## 2014-03-24 ENCOUNTER — Ambulatory Visit (HOSPITAL_COMMUNITY): Payer: Medicaid Other | Attending: Pain Medicine | Admitting: Occupational Therapy

## 2014-03-24 ENCOUNTER — Encounter (HOSPITAL_COMMUNITY): Payer: Self-pay | Admitting: Occupational Therapy

## 2014-03-24 DIAGNOSIS — M25532 Pain in left wrist: Secondary | ICD-10-CM | POA: Diagnosis present

## 2014-03-24 DIAGNOSIS — M79642 Pain in left hand: Secondary | ICD-10-CM | POA: Insufficient documentation

## 2014-03-24 DIAGNOSIS — M25632 Stiffness of left wrist, not elsewhere classified: Secondary | ICD-10-CM | POA: Diagnosis not present

## 2014-03-24 DIAGNOSIS — M2560 Stiffness of unspecified joint, not elsewhere classified: Secondary | ICD-10-CM

## 2014-03-24 NOTE — Therapy (Signed)
[place], [place], [address] Phone: [phone]   Fax:  [phone]  Occupational Therapy Treatment  Patient Details  Name: Sheila Conway MRN: [id number] Date of Birth: [birth year] Referring Provider:  Carroll Kinds, MD  Encounter Date: 03/24/2014      OT End of Session - 03/24/14 1157    Visit Number 2   Number of Visits 36   Date for OT Re-Evaluation 05/18/14  mini reassess on 04/18/14   Authorization Type workers compensation   OT Start Time 1104   OT Stop Time 1146   OT Time Calculation (min) 42 min   Activity Tolerance Patient tolerated treatment well   Behavior During Therapy [facility] for tasks assessed/performed      Past Medical History  Diagnosis Date  . Asthma   . Thyroid disease   . Bronchitis     Past Surgical History  Procedure Laterality Date  . Tubal ligation    . Cholecystectomy      LMP 06/29/2011  Visit Diagnosis:  Left hand pain  Wrist pain, left  Limited joint range of motion      Subjective Assessment - 03/24/14 1146    Symptoms S: It's very painful and I've been getting nerve blocks but they don't help much.    Currently in Pain? Yes   Pain Score 10-Worst pain ever   Pain Location Wrist  thumb   Pain Orientation Left   Pain Descriptors / Indicators Burning;Constant   Pain Type Neuropathic pain          [facility] OT Assessment - 03/24/14 1157    Assessment   Diagnosis Complex Regional Pain Syndrome Type 1 Left hand   Precautions   Precautions None               OT Treatments/Exercises (OP) - 03/24/14 1150    Exercises   Exercises Wrist;Hand   Wrist Exercises   Forearm Supination PROM;10 reps   Forearm Pronation PROM;10 reps   Wrist Flexion PROM;10 reps   Wrist Extension PROM;10 reps   Wrist Radial Deviation PROM;10 reps   Wrist Ulnar Deviation PROM;10 reps   Manual Therapy   Manual Therapy Myofascial release;Edema management   Edema Management  Manual edema massage to left hand including digits, thumb, volar and dorsal aspect of hand and wrist to decrease edema and increase joint mobility.    Myofascial Release Myofascial to volar and dorsal aspects of wrist to relax tone and improve range of motion.                  OT Short Term Goals - 03/24/14 1206    OT SHORT TERM GOAL #1   Title Patient will be educated on HEP.   Time 6   Period Weeks   Status On-going   OT SHORT TERM GOAL #2   Title Patient will improve PROM of left wrist and thumb by 20 degrees for increased ability to use left hand as a gross assist with daily tasks.    Time 6   Period Weeks   Status On-going   OT SHORT TERM GOAL #3   Title Patient will improve left wrist strength to 3+/5, left grip strength by 10 pounds, and lateral pinch strength by 2 pounds in order to impove ability to grip objects.   Time 6   Period Weeks   Status On-going   OT SHORT TERM GOAL #4   Title Patient will improve left hand coordination demonstrated  by being able to oppose thumb to index finger and increasing left webspace by .25 cm.   Time 6   Period Weeks   Status On-going   OT SHORT TERM GOAL #5   Title Patient will improve left thumb sensation to loss of protective sensation for increased safety.   Time 6   Period Weeks   Status On-going   OT SHORT TERM GOAL #6   Title Patient will decrease pain level to 7/10 or better in her left thumb and wrist.   Time 12   Period Weeks   Status On-going           OT Long Term Goals - 03/24/14 1207    OT LONG TERM GOAL #1   Title Patient will use her left hand as an active assist with all functional activities, BADLs, work, and leisure activities.   Time 12   Period Weeks   Status On-going   OT LONG TERM GOAL #2   Title Patient will improve PROM of left wrist and thumb by to [facility], and demonstrated 25% AROM  for increased ability to use left hand as an active assist with daily tasks.    Time 12   Period Weeks    Status On-going   OT LONG TERM GOAL #3   Title Patient will improve left wrist strength to 4-/5, left grip strength by 25 pounds, and lateral pinch strength by 5 pounds in order to impove ability to open containers and place hair in a pony tail independently.   Time 12   Period Weeks   Status On-going   OT LONG TERM GOAL #4   Title Patient will increase left hand coordination in order to fasten her pants and tie her shoes, as demonstrated by a functional pincer grasp and being able to oppose to her ring finger.   Time 12   Period Weeks   Status On-going   OT LONG TERM GOAL #5   Title Patient will improve sensation to diminshed light touch in her left thumb for increased safety when cooking.    Time 12   Period Weeks   Status On-going   OT LONG TERM GOAL #6   Title Patient will decrease pain in her left wrist and hand to 5/10 or better.   Time 12   Period Weeks   Status On-going   OT LONG TERM GOAL #7   Title Patient will decrease fascial restrictions to min-mod.   Time 12   Period Weeks   Status On-going               Plan - 03/24/14 1157    Clinical Impression Statement A: Initiated myofascial release and range of motion exercises this session. Patient reports she is completing the HEP provided last session. Pt demonstrates ability to achieve minimal wrist ulnar/radial deviation, with palm placed flat on a table. Pt reports she has purchased an activity kit to work on using her hand at home. Pt suspects she may have hit her thumb on something at home and is going to [facility] for an x-ray to further assess this and determine if there is any damage; therefore thumb ROM not initiated this date. Pt cannot feel any additional pain, besides the normal burning sensation. Educated pt on edema reduction at home.    Plan P: Continue MFR & wrist ROM exercises, work on thumb range of motion if appropriate (follow up on x-ray).         Problem  List There are no active  problems to display for this patient.   Guadelupe Sabin, OTR/L [phone]  03/24/2014, 12:08 PM  [facility] [address] [place], [place], [address] Phone: [phone]   Fax:  [phone]

## 2014-03-26 ENCOUNTER — Ambulatory Visit (HOSPITAL_COMMUNITY): Payer: Medicaid Other | Admitting: Specialist

## 2014-03-28 ENCOUNTER — Emergency Department (HOSPITAL_COMMUNITY): Payer: Medicaid Other

## 2014-03-28 ENCOUNTER — Emergency Department (HOSPITAL_COMMUNITY)
Admission: EM | Admit: 2014-03-28 | Discharge: 2014-03-28 | Disposition: A | Discharge status: Home / Self Care | Payer: Medicaid Other | Attending: Emergency Medicine | Admitting: Emergency Medicine

## 2014-03-28 ENCOUNTER — Ambulatory Visit (HOSPITAL_COMMUNITY): Payer: Medicaid Other | Admitting: Occupational Therapy

## 2014-03-28 ENCOUNTER — Encounter (HOSPITAL_COMMUNITY): Payer: Self-pay | Admitting: *Deleted

## 2014-03-28 DIAGNOSIS — Z880 Allergy status to penicillin: Secondary | ICD-10-CM | POA: Insufficient documentation

## 2014-03-28 DIAGNOSIS — Z91040 Latex allergy status: Secondary | ICD-10-CM | POA: Diagnosis not present

## 2014-03-28 DIAGNOSIS — T413X5A Adverse effect of local anesthetics, initial encounter: Secondary | ICD-10-CM

## 2014-03-28 DIAGNOSIS — Z79899 Other long term (current) drug therapy: Secondary | ICD-10-CM | POA: Insufficient documentation

## 2014-03-28 DIAGNOSIS — J45909 Unspecified asthma, uncomplicated: Secondary | ICD-10-CM | POA: Diagnosis not present

## 2014-03-28 DIAGNOSIS — Z9889 Other specified postprocedural states: Secondary | ICD-10-CM | POA: Diagnosis not present

## 2014-03-28 DIAGNOSIS — E079 Disorder of thyroid, unspecified: Secondary | ICD-10-CM | POA: Insufficient documentation

## 2014-03-28 DIAGNOSIS — R509 Fever, unspecified: Secondary | ICD-10-CM | POA: Diagnosis present

## 2014-03-28 DIAGNOSIS — Z720 Tobacco use: Secondary | ICD-10-CM | POA: Insufficient documentation

## 2014-03-28 LAB — CBC
HCT: 42.8 % (ref 36.0–46.0)
HEMOGLOBIN: 14.3 g/dL (ref 12.0–15.0)
MCH: 30.3 pg (ref 26.0–34.0)
MCHC: 33.4 g/dL (ref 30.0–36.0)
MCV: 90.7 fL (ref 78.0–100.0)
Platelets: 255 10*3/uL (ref 150–400)
RBC: 4.72 MIL/uL (ref 3.87–5.11)
RDW: 12.7 % (ref 11.5–15.5)
WBC: 9.1 10*3/uL (ref 4.0–10.5)

## 2014-03-28 NOTE — ED Notes (Signed)
Face and upper chest red, says temp was 103 this am. Had injection on Wed. And started with the redness and fever today.  Alert, NAD.

## 2014-03-28 NOTE — ED Provider Notes (Signed)
CSN: [id number]     Arrival date & time 03/28/14  1838 History   First MD Initiated Contact with Patient 03/28/14 1935     No chief complaint on file.    (Consider location/radiation/quality/duration/timing/severity/associated sxs/prior Treatment) HPI Comments: Patient had a nerve block or digital block placed on the left wrist as well as in the left clavicular area yesterday.  Since that time.  She reports that she's had some localized redness and today.  If reported fever to 103.2.  That since resolved without any medication.  She has not had any shortness of breath, difficulty swallowing, tongue swelling, redness or swelling in any other area  The history is provided by the patient.    Past Medical History  Diagnosis Date  . Asthma   . Thyroid disease   . Bronchitis    Past Surgical History  Procedure Laterality Date  . Tubal ligation    . Cholecystectomy     History reviewed. No pertinent family history. History  Substance Use Topics  . Smoking status: Current Every Day Smoker -- 1.00 packs/day    Types: Cigarettes  . Smokeless tobacco: Not on file  . Alcohol Use: No   OB History    No data available     Review of Systems  Constitutional: Negative for fever.  HENT: Negative for drooling.   Respiratory: Negative for cough, shortness of breath and wheezing.   Cardiovascular: Negative for chest pain and leg swelling.  Musculoskeletal: Negative for joint swelling.  Neurological: Negative for dizziness.  All other systems reviewed and are negative.     Allergies  Penicillins; Codeine; Gabapentin; Latex; and Oxybutynin  Home Medications   Prior to Admission medications   Medication Sig Start Date End Date Taking? Authorizing Provider  albuterol (PROVENTIL HFA;VENTOLIN HFA) 108 (90 BASE) MCG/ACT inhaler Inhale 2 puffs into the lungs every 6 (six) hours as needed. For wheezing   Yes Historical Provider, MD  amitriptyline (ELAVIL) 25 MG tablet Take 25 mg by mouth at  bedtime. 11/20/13  Yes Historical Provider, MD  Cyanocobalamin (VITAMIN B 12 PO) Take 1 tablet by mouth daily.   Yes Historical Provider, MD  EPINEPHrine (EPIPEN) 0.3 mg/0.3 mL IJ SOAJ injection Inject 0.3 mLs (0.3 mg total) into the muscle as needed. 08/04/13  Yes Margarita Grizzle, MD  ibuprofen (ADVIL,MOTRIN) 200 MG tablet Take 1,000 mg by mouth every 6 (six) hours as needed for mild pain or moderate pain.    Yes Historical Provider, MD  ipratropium-albuterol (DUONEB) 0.5-2.5 (3) MG/3ML SOLN Take 3 mLs by nebulization every 4 (four) hours as needed (shortness of breath).    Yes Historical Provider, MD  levothyroxine (SYNTHROID, LEVOTHROID) 25 MCG tablet Take 37.5 mcg by mouth daily before breakfast.   Yes Historical Provider, MD  omeprazole (PRILOSEC) 20 MG capsule Take 20 mg by mouth 2 (two) times daily before a meal.    Yes Historical Provider, MD  simvastatin (ZOCOR) 20 MG tablet Take 20 mg by mouth at bedtime.   Yes Historical Provider, MD  tiotropium (SPIRIVA) 18 MCG inhalation capsule Place 18 mcg into inhaler and inhale at bedtime.   Yes Historical Provider, MD  Vitamin D, Ergocalciferol, (DRISDOL) 50000 UNITS CAPS capsule Take 50,000 Units by mouth every 7 (seven) days. monday   Yes Historical Provider, MD  chlorpheniramine-HYDROcodone (TUSSIONEX PENNKINETIC ER) 10-8 MG/5ML LQCR Take 5 mLs by mouth 2 (two) times daily. Patient not taking: Reported on 03/28/2014 03/09/14   Tammi Triplett, PA-C  doxycycline (VIBRAMYCIN) 100 MG  capsule Take 1 capsule (100 mg total) by mouth 2 (two) times daily. Patient not taking: Reported on 03/28/2014 03/09/14   Tammi Triplett, PA-C  predniSONE (DELTASONE) 10 MG tablet Take 6 tablets day one, 5 tablets day two, 4 tablets day three, 3 tablets day four, 2 tablets day five, then 1 tablet day six Patient not taking: Reported on 03/28/2014 03/09/14   Tammi Triplett, PA-C   BP 107/66 mmHg  Pulse 79  Temp(Src) 99 F (37.2 C) (Oral)  Resp 18  Ht 5\' 6"  (1.676 m)  Wt 173  lb (78.472 kg)  BMI 27.94 kg/m2  SpO2 98%  LMP 06/29/2011 Physical Exam  Constitutional: She appears well-developed and well-nourished.  HENT:  Head: Normocephalic.  Neck: Normal range of motion.  Cardiovascular: Normal rate and regular rhythm.   Pulmonary/Chest: Effort normal.  Abdominal: Soft.  Musculoskeletal: Normal range of motion.  Neurological: She is alert.  Skin: Skin is warm. There is erythema.     Nursing note and vitals reviewed.   ED Course  Procedures (including critical care time) Labs Review Labs Reviewed  CBC  I-STAT CHEM 8, ED    Imaging Review Dg Finger Thumb Left  03/28/2014   CLINICAL DATA:  44 year old female with left-sided thumb pain, redness and swelling. History of recent nerve block.  EXAM: LEFT THUMB 2+V  COMPARISON:  No priors.  FINDINGS: Three views of the left thumb demonstrate diffuse soft tissue swelling, particularly adjacent to the first metatarsal. There is a mottled appearance of the first metatarsal with several areas of lucency, somewhat ill-defined, particularly at the base of the metatarsal where there is a 6 mm lucent area that has poorly defined borders. No acute displaced fracture, subluxation or dislocation.  IMPRESSION: 1. Soft tissue swelling around the thumb, particularly the base of the thumb. The first metatarsal has a mottled appearance, with several ill-defined lucent areas, particularly at the base of the first metatarsal. The possibility of osteomyelitis should be considered given the patient's history, and these findings could be better delineated with followup nonemergent contrast enhanced MRI of the thumb if clinically appropriate.   Electronically Signed   By: Trudie Reedaniel  Entrikin M.D.   On: 03/28/2014 20:08     EKG Interpretation None     [facility]han x-ray shows that she really has no specific change since previous x-rays.  Her white count is normal, I feel that this is a physical reaction to the injected medication at the wrist  and clavicle.  She's been instructed to return at any time she develops worsening symptoms such as shortness of breath, tachycardia, throat swelling, worsening erythema or swelling at the injection site.  She is to return immediately to the emergency department for further evaluation MDM   Final diagnoses:  Local anesthetic drug adverse reaction, initial encounter         Earley FavorGail Druscilla Petsch, NP 03/28/14 2120  Zadie Rhineonald Wickline, MD 03/28/14 2256

## 2014-03-28 NOTE — Discharge Instructions (Signed)
Call Dr. Isaiah SergeBertram first thing Monday morning telling me had an allergic or adverse local reaction at the injection sites.  He had a normal white count and no sign of cellulitis.  If at anytime your swelling reactions, redness gets worse, he developed shortness of breath, tachycardia, or have rapid heart rate or worsening symptoms please return immediately to the emergency department for further evaluation

## 2014-03-28 NOTE — ED Notes (Signed)
Pt states she had a nerve block on Wednesday and was told to watch out for fevers. Pt had a fever this morning of 103.2. Pt is also red all over. Pt also has an order for her left thumb to be xrayed.

## 2014-03-31 ENCOUNTER — Ambulatory Visit (HOSPITAL_COMMUNITY): Payer: Medicaid Other | Admitting: Specialist

## 2014-04-02 ENCOUNTER — Ambulatory Visit (HOSPITAL_COMMUNITY): Payer: Medicaid Other

## 2014-04-04 ENCOUNTER — Ambulatory Visit (HOSPITAL_COMMUNITY): Payer: Medicaid Other

## 2014-04-07 ENCOUNTER — Telehealth (HOSPITAL_COMMUNITY): Payer: Self-pay

## 2014-04-07 ENCOUNTER — Ambulatory Visit (HOSPITAL_COMMUNITY): Payer: Medicaid Other | Admitting: Specialist

## 2014-04-07 NOTE — Telephone Encounter (Signed)
Pt had to cancel all of her appts ,she dont have any transportation.

## 2014-04-09 ENCOUNTER — Encounter (HOSPITAL_COMMUNITY): Payer: Self-pay | Admitting: Specialist

## 2014-04-11 ENCOUNTER — Encounter (HOSPITAL_COMMUNITY): Payer: Self-pay

## 2014-04-14 ENCOUNTER — Encounter (HOSPITAL_COMMUNITY): Payer: Self-pay | Admitting: Specialist

## 2014-04-16 ENCOUNTER — Encounter (HOSPITAL_COMMUNITY): Payer: Self-pay | Admitting: Specialist

## 2014-04-18 ENCOUNTER — Encounter (HOSPITAL_COMMUNITY): Payer: Self-pay

## 2014-04-23 ENCOUNTER — Encounter (HOSPITAL_COMMUNITY): Payer: Self-pay | Admitting: Emergency Medicine

## 2014-04-23 ENCOUNTER — Emergency Department (HOSPITAL_COMMUNITY)
Admission: EM | Admit: 2014-04-23 | Discharge: 2014-04-23 | Disposition: A | Discharge status: Home / Self Care | Payer: Medicaid Other | Attending: Emergency Medicine | Admitting: Emergency Medicine

## 2014-04-23 ENCOUNTER — Encounter (HOSPITAL_COMMUNITY): Payer: Self-pay

## 2014-04-23 ENCOUNTER — Emergency Department (HOSPITAL_COMMUNITY): Payer: Medicaid Other

## 2014-04-23 DIAGNOSIS — Y998 Other external cause status: Secondary | ICD-10-CM | POA: Insufficient documentation

## 2014-04-23 DIAGNOSIS — Z792 Long term (current) use of antibiotics: Secondary | ICD-10-CM | POA: Insufficient documentation

## 2014-04-23 DIAGNOSIS — Z720 Tobacco use: Secondary | ICD-10-CM | POA: Diagnosis not present

## 2014-04-23 DIAGNOSIS — Y9389 Activity, other specified: Secondary | ICD-10-CM | POA: Diagnosis not present

## 2014-04-23 DIAGNOSIS — S59911A Unspecified injury of right forearm, initial encounter: Secondary | ICD-10-CM | POA: Insufficient documentation

## 2014-04-23 DIAGNOSIS — M25531 Pain in right wrist: Secondary | ICD-10-CM | POA: Diagnosis present

## 2014-04-23 DIAGNOSIS — X58XXXA Exposure to other specified factors, initial encounter: Secondary | ICD-10-CM | POA: Insufficient documentation

## 2014-04-23 DIAGNOSIS — J45909 Unspecified asthma, uncomplicated: Secondary | ICD-10-CM | POA: Insufficient documentation

## 2014-04-23 DIAGNOSIS — Y9289 Other specified places as the place of occurrence of the external cause: Secondary | ICD-10-CM | POA: Diagnosis not present

## 2014-04-23 DIAGNOSIS — Z91040 Latex allergy status: Secondary | ICD-10-CM | POA: Insufficient documentation

## 2014-04-23 DIAGNOSIS — Z880 Allergy status to penicillin: Secondary | ICD-10-CM | POA: Insufficient documentation

## 2014-04-23 DIAGNOSIS — E079 Disorder of thyroid, unspecified: Secondary | ICD-10-CM | POA: Insufficient documentation

## 2014-04-23 DIAGNOSIS — Z7952 Long term (current) use of systemic steroids: Secondary | ICD-10-CM | POA: Insufficient documentation

## 2014-04-23 DIAGNOSIS — Z79899 Other long term (current) drug therapy: Secondary | ICD-10-CM | POA: Insufficient documentation

## 2014-04-23 MED ORDER — IBUPROFEN 800 MG PO TABS: 800.0000 mg | ORAL_TABLET | Freq: Three times a day (TID) | ORAL | Status: DC

## 2014-04-23 NOTE — ED Notes (Signed)
Pt c/o rt wrist pain for about 4 days and denies any injury.

## 2014-04-23 NOTE — ED Notes (Signed)
Pt presents w/ a knot on the right lower forearm that came up 4 days ago. Denies any injury.

## 2014-04-23 NOTE — ED Notes (Signed)
Pt alert & oriented x4, stable gait. Patient given discharge instructions, paperwork & prescription(s). Patient  instructed to stop at the registration desk to finish any additional paperwork. Patient verbalized understanding. Pt left department w/ no further questions. 

## 2014-04-23 NOTE — Therapy (Signed)
[facility] [address] [place], [place], [address] Phone: [phone]   Fax:  [phone]  Patient Details  Name: Sheila Conway MRN: [id number] Date of Birth: 05/02/1970 Referring Provider:  No ref. provider found  Encounter Date: 04/23/2014 OCCUPATIONAL THERAPY DISCHARGE SUMMARY  Visits from Start of Care: 2  Current functional level related to goals / functional outcomes: OT LONG TERM GOAL #1    Title Patient will use her left hand as an active assist with all functional activities, BADLs, work, and leisure activities.   Time 12   Period Weeks   Status On-going   OT LONG TERM GOAL #2   Title Patient will improve PROM of left wrist and thumb by to WFL, and demonstrated 25% AROM for increased ability to use left hand as an active assist with daily tasks.    Time 12   Period Weeks   Status On-going   OT LONG TERM GOAL #3   Title Patient will improve left wrist strength to 4-/5, left grip strength by 25 pounds, and lateral pinch strength by 5 pounds in order to impove ability to open containers and place hair in a pony tail independently.   Time 12   Period Weeks   Status On-going   OT LONG TERM GOAL #4   Title Patient will increase left hand coordination in order to fasten her pants and tie her shoes, as demonstrated by a functional pincer grasp and being able to oppose to her ring finger.   Time 12   Period Weeks   Status On-going   OT LONG TERM GOAL #5   Title Patient will improve sensation to diminshed light touch in her left thumb for increased safety when cooking.    Time 12   Period Weeks   Status On-going   OT LONG TERM GOAL #6   Title Patient will decrease pain in her left wrist and hand to 5/10 or better.   Time 12   Period Weeks   Status On-going   OT LONG TERM GOAL #7   Title Patient will decrease fascial restrictions to min-mod.    Time 12   Period Weeks   Status On-going          Remaining deficits: Patient only completed 2 therapy visits and was unable to continue therapy due to transportation issues. No goals were met.    Education / Equipment: HEP Plan: Patient agrees to discharge.  Patient goals were not met. Patient is being discharged due to the patient's request.  ?????       Laura Essenmacher, OTR/L,CBIS  [phone]  04/23/2014, 3:57 PM  [facility] [address] [place], [place], [address] Phone: [phone]   Fax:  [phone] 

## 2014-04-26 NOTE — ED Provider Notes (Signed)
CSN: [id number]     Arrival date & time 04/23/14  2003 History   First MD Initiated Contact with Patient 04/23/14 2023     Chief Complaint  Patient presents with  . Wrist Pain     (Consider location/radiation/quality/duration/timing/severity/associated sxs/prior Treatment) HPI  Sheila Conway is a 44 y.o. female who presents to the Emergency Department complaining of wrist/forearm pain that began 4 days ago. She describes pain that is worse with movement of her wrist and lifting objects.  She also noticed a "knot" to her forearm at the onset of pain.  She denies known injury, numbness, swelling of her arm or weakness.  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    Past Medical History  Diagnosis Date  . Asthma   . Thyroid disease   . Bronchitis    Past Surgical History  Procedure Laterality Date  . Tubal ligation    . Cholecystectomy     History reviewed. No pertinent family history. History  Substance Use Topics  . Smoking status: Current Every Day Smoker -- 1.00 packs/day    Types: Cigarettes  . Smokeless tobacco: Not on file  . Alcohol Use: No   OB History    No data available     Review of Systems  Constitutional: Negative for fever and chills.  Genitourinary: Negative for dysuria and difficulty urinating.  Musculoskeletal: Positive for joint swelling and arthralgias (right wrist and forearm).  Skin: Negative for color change and wound.  Neurological: Negative for weakness and numbness.  All other systems reviewed and are negative.     Allergies  Penicillins; Codeine; Gabapentin; Latex; and Oxybutynin  Home Medications   Prior to Admission medications   Medication Sig Start Date End Date Taking? Authorizing Provider  albuterol (PROVENTIL HFA;VENTOLIN HFA) 108 (90 BASE) MCG/ACT inhaler Inhale 2 puffs into the lungs every 6 (six) hours as needed.  For wheezing    Historical Provider, MD  amitriptyline (ELAVIL) 25 MG tablet Take 25 mg by mouth at bedtime. 11/20/13   Historical Provider, MD  chlorpheniramine-HYDROcodone (TUSSIONEX PENNKINETIC ER) 10-8 MG/5ML LQCR Take 5 mLs by mouth 2 (two) times daily. Patient not taking: Reported on 03/28/2014 03/09/14   Tammi Maliah Pyles, PA-C  Cyanocobalamin (VITAMIN B 12 PO) Take 1 tablet by mouth daily.    Historical Provider, MD  doxycycline (VIBRAMYCIN) 100 MG capsule Take 1 capsule (100 mg total) by mouth 2 (two) times daily. Patient not taking: Reported on 03/28/2014 03/09/14   Tammi Olliver Boyadjian, PA-C  EPINEPHrine (EPIPEN) 0.3 mg/0.3 mL IJ SOAJ injection Inject 0.3 mLs (0.3 mg total) into the muscle as needed. 08/04/13   Margarita Grizzleanielle Ray, MD  ibuprofen (ADVIL,MOTRIN) 800 MG tablet Take 1 tablet (800 mg total) by mouth 3 (three) times daily. Take with food 04/23/14   Tammi Jessen Siegman, PA-C  ipratropium-albuterol (DUONEB) 0.5-2.5 (3) MG/3ML SOLN Take 3 mLs by nebulization every 4 (four) hours as needed (shortness of breath).     Historical Provider, MD  levothyroxine (SYNTHROID, LEVOTHROID) 25 MCG tablet Take 37.5 mcg by mouth daily before breakfast.    Historical Provider, MD  omeprazole (PRILOSEC) 20 MG capsule Take 20 mg by mouth 2 (two) times daily before a meal.     Historical Provider, MD  predniSONE (DELTASONE) 10 MG tablet Take 6 tablets day one, 5 tablets day two, 4 tablets day three, 3 tablets day four,  2 tablets day five, then 1 tablet day six Patient not taking: Reported on 03/28/2014 03/09/14   Tammi Edythe Riches, PA-C  simvastatin (ZOCOR) 20 MG tablet Take 20 mg by mouth at bedtime.    Historical Provider, MD  tiotropium (SPIRIVA) 18 MCG inhalation capsule Place 18 mcg into inhaler and inhale at bedtime.    Historical Provider, MD  Vitamin D, Ergocalciferol, (DRISDOL) 50000 UNITS CAPS capsule Take 50,000 Units by mouth every 7 (seven) days. monday    Historical Provider, MD   BP 107/62 mmHg  Pulse 81  Temp(Src)  98.8 F (37.1 C)  Resp 18  Ht  (1.651 m)  Wt 173 lb (78.472 kg)  BMI 28.79 kg/m2  SpO2 99%  LMP 06/29/2011 Physical Exam  Constitutional: She is oriented to person, place, and time. She appears well-developed and well-nourished. No distress.  HENT:  Head: Normocephalic and atraumatic.  Neck: Normal range of motion. Neck supple.  Cardiovascular: Normal rate, regular rhythm and intact distal pulses.   No murmur heard. Pulmonary/Chest: Effort normal and breath sounds normal. No respiratory distress.  Musculoskeletal: Normal range of motion. She exhibits tenderness.  Localized tenderness and a small firm nodule to the anterior right distal forearm.  No discoloration. Excessive warmth or proximal tenderness.  Neurological: She is alert and oriented to person, place, and time. Coordination normal.  Skin: Skin is warm and dry.  Nursing note and vitals reviewed.   ED Course  Procedures (including critical care time) Labs Review Labs Reviewed - No data to display  Imaging Review No results found.   EKG Interpretation None      MDM   Final diagnoses:  Forearm injury, right, initial encounter   Localized tenderness of the right distal forearm.  NV intact.  Pt has full ROM.  XR neg for acute inury.  Pt agrees to orthopedic f/u.  Ibuprofen for pain   Severiano Gilbert, PA-C 04/26/14 2317  Kristen N Ward, DO 04/29/14 1610

## 2014-06-12 ENCOUNTER — Ambulatory Visit (INDEPENDENT_AMBULATORY_CARE_PROVIDER_SITE_OTHER): Payer: Medicaid Other | Admitting: Orthopedic Surgery

## 2014-06-12 VITALS — BP 93/57 | Ht 65.0 in | Wt 173.0 lb

## 2014-06-12 DIAGNOSIS — M654 Radial styloid tenosynovitis [de Quervain]: Secondary | ICD-10-CM

## 2014-06-13 NOTE — Progress Notes (Signed)
Patient ID: Sheila Conway, female   DOB: 06-07-1970, 44 y.o.   MRN: [id number]  Chief Complaint  Patient presents with  . Wrist Pain    er follow up right wrist pain, no known injury, refer [facility]   History this patient presents with pain in her right wrist and the pain is over the radial side of the right wrist. It's been present for several days to weeks it's sharp burning aching moderately severe worse with wrist ulnar deviation and flexion  System review she has positive system review for musculoskeletal system traumatic injury left wrist chronic pain tendon injury loss of function. No significant skin changes. No fever or chills related to her wrist. Past Medical History  Diagnosis Date  . Asthma   . Thyroid disease   . Bronchitis     BP 93/57 mmHg  Ht 5\' 5"  (1.651 m)  Wt 173 lb (78.472 kg)  BMI 28.79 kg/m2  LMP 06/29/2011 Normal development grooming and hygiene no gross deformities she has a wrist splint on the left side from a chronic injury she is oriented 3 mood is pleasant gait is normal  Right wrist is tender over the first extensor compartment she has painful ulnar deviation and flexion but normal passive range of motion. Arista stable Watson test negative exam is normal skin is intact normal sensation vascularity is excellent and she has no lymphangitis or lymph nodes positive at the elbow or axilla  The x-rays are normal  The patient's diagnosis de Quervain's tenosynovitis  Recommend splint she'll get an over-the-counter she has Medicaid  Follow-up 6 weeks

## 2014-07-24 ENCOUNTER — Ambulatory Visit (INDEPENDENT_AMBULATORY_CARE_PROVIDER_SITE_OTHER): Payer: Medicaid Other | Admitting: Orthopedic Surgery

## 2014-07-24 VITALS — BP 117/81 | Ht 65.0 in | Wt 185.0 lb

## 2014-07-24 DIAGNOSIS — M65311 Trigger thumb, right thumb: Secondary | ICD-10-CM | POA: Diagnosis not present

## 2014-07-24 DIAGNOSIS — M654 Radial styloid tenosynovitis [de Quervain]: Secondary | ICD-10-CM | POA: Diagnosis not present

## 2014-07-24 NOTE — Progress Notes (Signed)
follow up Patient ID: Sheila BussingBobbie Conway, female   DOB: 26-Oct-1970, 44 y.o.   MRN: [id number]  Follow up visit  Chief Complaint  Patient presents with  . Follow-up    6 week follow up right wrist DQV    BP 117/81 mmHg  Ht 5\' 5"  (1.651 m)  Wt 185 lb (83.915 kg)  BMI 30.79 kg/m2  LMP 06/29/2011  Encounter Diagnoses  Name Primary?  Tommi Rumps. De Quervain's tenosynovitis, right Yes  . Trigger thumb of right hand     Follow-up visit status post treatment for de Quervain's syndrome with bracing. She did not make significant improvement and also developed triggering of her right thumb with pain over the A1 pulley and tenderness and locking  Review of systems she still has the left thumb tendon problem which we will [facility]  Right thumb tenderness over the A1 pulley catching locking. Tenderness over the first extensor compartment with swelling of the first and third extensor compartment muscle bellies  Sensation intact good pulses good color. Flexor tendon intact extensor tendon intact  Inject first extensor compartment  Splint she did not get a splint. She tried but they gave her the wrong once I put her in a [facility] for cash pay   Right Trigger thumb injection Medication  1 mL of 40 mg Depo-Medrol  2 mL of 1% lidocaine plain  Ethyl chloride for anesthesia  Verbal consent was obtained timeout was taken to confirm the injection site as right thumb  Alcohol was used to prepare the skin along with ethyl chloride and then the injection was made at the A1 pulley there were no complications

## 2014-09-04 ENCOUNTER — Encounter: Payer: Self-pay | Admitting: Orthopedic Surgery

## 2014-09-04 ENCOUNTER — Ambulatory Visit: Payer: Medicaid Other | Admitting: Orthopedic Surgery

## 2015-05-07 IMAGING — DX DG WRIST COMPLETE 3+V*R*
4 series · 4 of 4 positions shown · non-contrast
Comparison: None.

CLINICAL DATA: Initial encounter for "Knot" which appeared on
posterior aspect of right wrist 4 days ago. No known injury.

EXAM:
RIGHT WRIST - COMPLETE 3+ VIEW

[wrist pa]
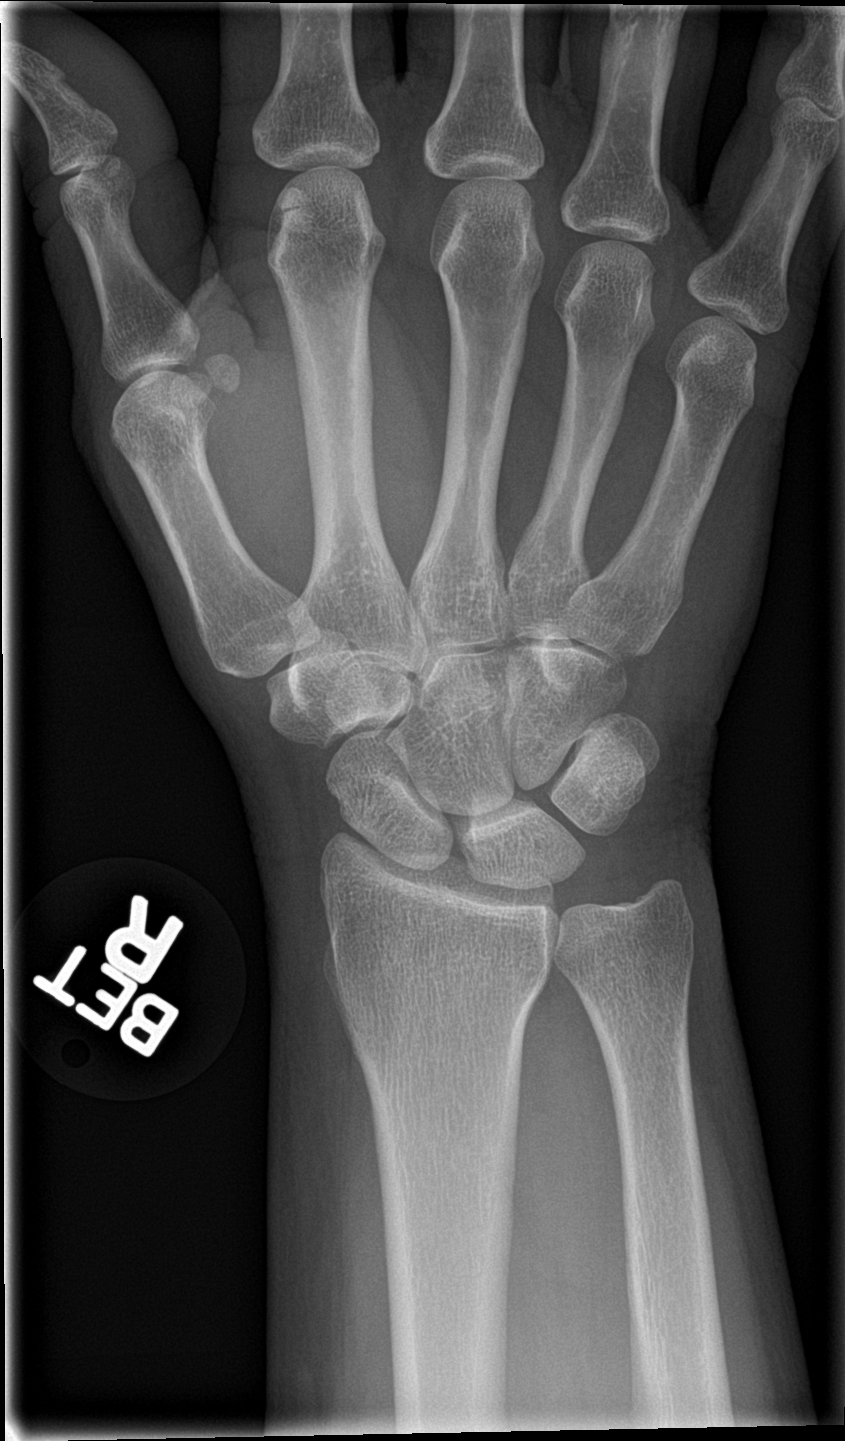

[wrist obl]
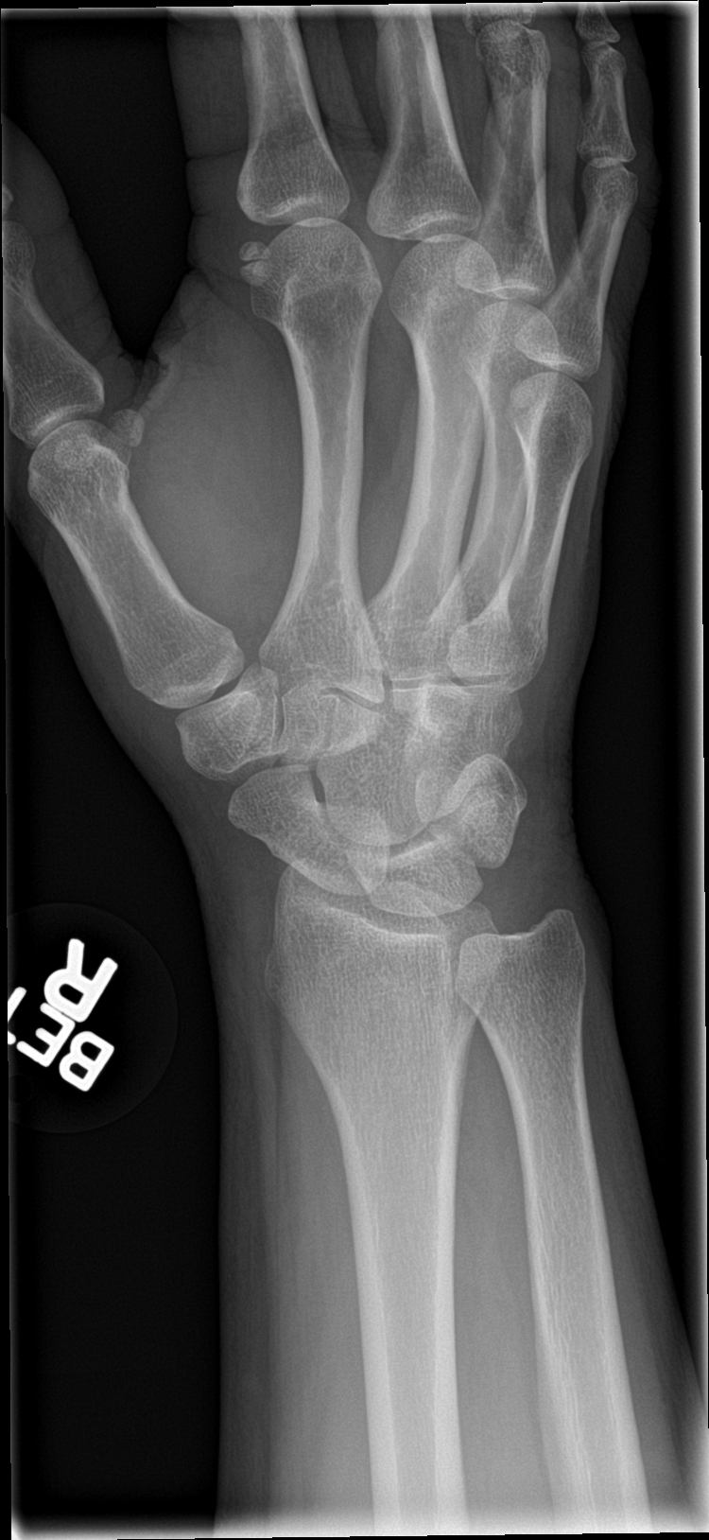

[wrist lat]
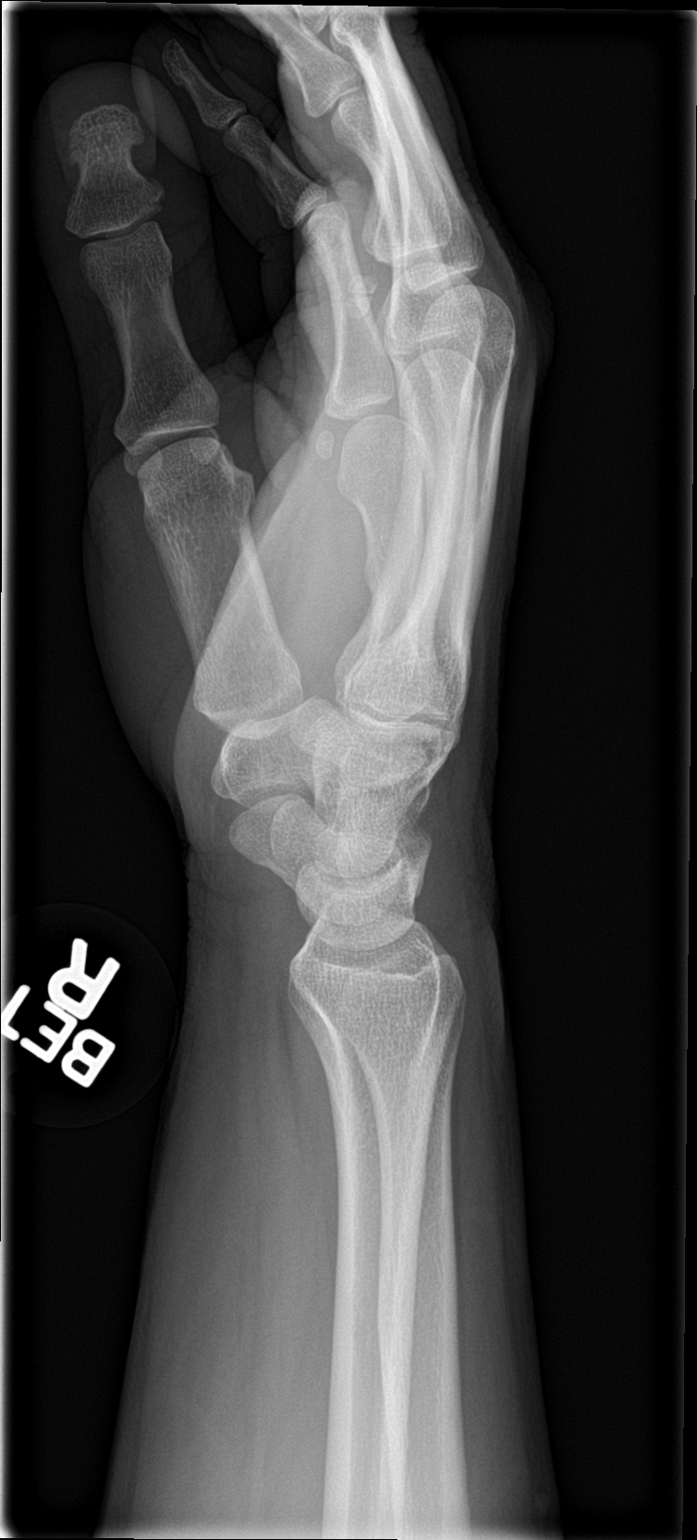

[wrist navicular]
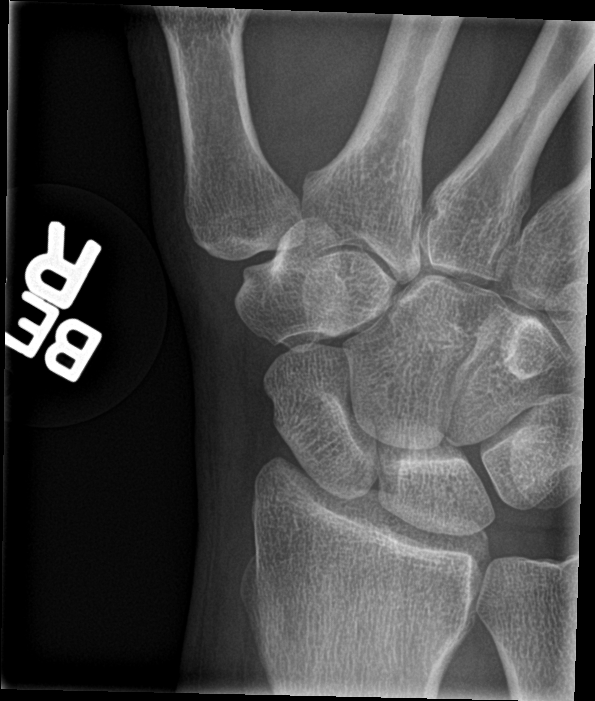

[4 of 4 positions shown; findings below may reference images not displayed]

FINDINGS: There is no evidence of fracture or dislocation. There is no
evidence of arthropathy or other focal bone abnormality. Soft
tissues are unremarkable.
IMPRESSION: Negative.

## 2015-07-15 ENCOUNTER — Encounter (HOSPITAL_COMMUNITY): Payer: Self-pay | Admitting: Emergency Medicine

## 2015-07-15 ENCOUNTER — Emergency Department (HOSPITAL_COMMUNITY)
Admission: EM | Admit: 2015-07-15 | Discharge: 2015-07-15 | Disposition: A | Discharge status: Home / Self Care | Payer: Self-pay | Attending: Emergency Medicine | Admitting: Emergency Medicine

## 2015-07-15 ENCOUNTER — Emergency Department (HOSPITAL_COMMUNITY): Payer: Self-pay

## 2015-07-15 DIAGNOSIS — Z91040 Latex allergy status: Secondary | ICD-10-CM | POA: Insufficient documentation

## 2015-07-15 DIAGNOSIS — F17210 Nicotine dependence, cigarettes, uncomplicated: Secondary | ICD-10-CM | POA: Insufficient documentation

## 2015-07-15 DIAGNOSIS — J40 Bronchitis, not specified as acute or chronic: Secondary | ICD-10-CM | POA: Insufficient documentation

## 2015-07-15 MED ORDER — KETOROLAC TROMETHAMINE 60 MG/2ML IM SOLN
60.0000 mg | Freq: Once | INTRAMUSCULAR | Status: AC
  Administered 2015-07-15: 60 mg via INTRAMUSCULAR
  Filled 2015-07-15: qty 2

## 2015-07-15 MED ORDER — IPRATROPIUM BROMIDE 0.02 % IN SOLN
0.5000 mg | Freq: Once | RESPIRATORY_TRACT | Status: AC
  Administered 2015-07-15: 0.5 mg via RESPIRATORY_TRACT
  Filled 2015-07-15: qty 2.5

## 2015-07-15 MED ORDER — ALBUTEROL (5 MG/ML) CONTINUOUS INHALATION SOLN
10.0000 mg/h | INHALATION_SOLUTION | Freq: Once | RESPIRATORY_TRACT | Status: AC
  Administered 2015-07-15: 10 mg/h via RESPIRATORY_TRACT
  Filled 2015-07-15: qty 20

## 2015-07-15 MED ORDER — PREDNISONE 10 MG (21) PO TBPK: 10.0000 mg | ORAL_TABLET | Freq: Every day | ORAL | Status: DC

## 2015-07-15 MED ORDER — DEXAMETHASONE SODIUM PHOSPHATE 10 MG/ML IJ SOLN
10.0000 mg | Freq: Once | INTRAMUSCULAR | Status: AC
  Administered 2015-07-15: 10 mg via INTRAMUSCULAR
  Filled 2015-07-15: qty 1

## 2015-07-15 MED ORDER — AZITHROMYCIN 250 MG PO TABS: 250.0000 mg | ORAL_TABLET | Freq: Every day | ORAL | Status: DC

## 2015-07-15 NOTE — Discharge Instructions (Signed)
Read the information below.  Use the prescribed medication as directed.  Please discuss all new medications with your pharmacist.  You may return to the Emergency Department at any time for worsening condition or any new symptoms that concern you.   If you develop worsening shortness of breath, uncontrolled wheezing, severe chest pain, or fevers despite using tylenol and/or ibuprofen, return for a recheck.     °

## 2015-07-15 NOTE — ED Notes (Signed)
Pt states for the last 2 weeks she has has a productive cough and feels congested. Pt also c/o of pain in right side of mid back worse with coughing. Pt denies any chest pain or sob.

## 2015-07-15 NOTE — ED Provider Notes (Signed)
CSN: [id number]     Arrival date & time 07/15/15  2010 History   First MD Initiated Contact with Patient 07/15/15 2058     Chief Complaint  Patient presents with  . Cough  . Back Pain     (Consider location/radiation/quality/duration/timing/severity/associated sxs/prior Treatment) The history is provided by the patient.     Pt with hx asthma, current smoker, p/w 2 weeks of cough, SOB.  States she initially had other URI symptoms that have resolved.  Cough was initially productive but has now become dry.  She has had worsening over the past 2-3 days.  Last fever was 1 week ago.  Has tried mucinex, tussionex, dayquil, home neb treatments.  Denies leg swelling, hemoptysis.    Past Medical History  Diagnosis Date  . Asthma   . Thyroid disease   . Bronchitis    Past Surgical History  Procedure Laterality Date  . Tubal ligation    . Cholecystectomy     No family history on file. Social History  Substance Use Topics  . Smoking status: Current Every Day Smoker -- 1.00 packs/day    Types: Cigarettes  . Smokeless tobacco: None  . Alcohol Use: No   OB History    No data available     Review of Systems  All other systems reviewed and are negative.     Allergies  Bee venom; Penicillins; Shellfish-derived products; Codeine; Gabapentin; Latex; Oxybutynin; and Tape  Home Medications   Prior to Admission medications   Medication Sig Start Date End Date Taking? Authorizing Provider  albuterol (PROVENTIL HFA;VENTOLIN HFA) 108 (90 BASE) MCG/ACT inhaler Inhale 2 puffs into the lungs every 6 (six) hours as needed. For wheezing   Yes Historical Provider, MD  EPINEPHrine (EPIPEN) 0.3 mg/0.3 mL IJ SOAJ injection Inject 0.3 mLs (0.3 mg total) into the muscle as needed. 08/04/13  Yes Margarita Grizzleanielle Ray, MD  ibuprofen (ADVIL,MOTRIN) 800 MG tablet Take 1 tablet (800 mg total) by mouth 3 (three) times daily. Take with food 04/23/14  Yes Tammy Triplett, PA-C  ipratropium-albuterol (DUONEB) 0.5-2.5 (3)  MG/3ML SOLN Take 3 mLs by nebulization every 4 (four) hours as needed (shortness of breath).    Yes Historical Provider, MD  chlorpheniramine-HYDROcodone (TUSSIONEX PENNKINETIC ER) 10-8 MG/5ML LQCR Take 5 mLs by mouth 2 (two) times daily. Patient not taking: Reported on 03/28/2014 03/09/14   Tammy Triplett, PA-C  doxycycline (VIBRAMYCIN) 100 MG capsule Take 1 capsule (100 mg total) by mouth 2 (two) times daily. Patient not taking: Reported on 03/28/2014 03/09/14   Tammy Triplett, PA-C  predniSONE (DELTASONE) 10 MG tablet Take 6 tablets day one, 5 tablets day two, 4 tablets day three, 3 tablets day four, 2 tablets day five, then 1 tablet day six Patient not taking: Reported on 03/28/2014 03/09/14   Tammy Triplett, PA-C   BP 110/76 mmHg  Pulse 85  Temp(Src) 98.4 F (36.9 C) (Oral)  Resp 22  Ht 5\' 2"  (1.575 m)  Wt 83.66 kg  BMI 33.73 kg/m2  SpO2 95%  LMP 06/29/2011 Physical Exam  Constitutional: She appears well-developed and well-nourished. No distress.  HENT:  Head: Normocephalic and atraumatic.  Eyes: Conjunctivae are normal.  Neck: Neck supple.  Cardiovascular: Normal rate and regular rhythm.   Pulmonary/Chest: Effort normal and breath sounds normal. No respiratory distress. She has no wheezes. She has no rales.  Shallow breathing, coarse breath sounds   Neurological: She is alert.  Skin: She is not diaphoretic.  Nursing note and vitals reviewed.  ED Course  Procedures (including critical care time) Labs Review Labs Reviewed - No data to display  Imaging Review Dg Chest 2 View  07/15/2015  CLINICAL DATA:  Shortness of breath and dry cough for 13 days EXAM: CHEST  2 VIEW COMPARISON:  January 31, 2014 FINDINGS: The heart size and mediastinal contours are within normal limits. Minimal increased pulmonary interstitium is identified bilaterally. There is no focal pneumonia or pleural effusion. The visualized skeletal structures are unremarkable. IMPRESSION: Minimal increased pulmonary  interstitium bilaterally which can be seen in bronchitis. No focal pneumonia is identified. Electronically Signed   By: Sherian ReinWei-Chen  Lin M.D.   On: 07/15/2015 21:26   I have personally reviewed and evaluated these images and lab results as part of my medical decision-making.   EKG Interpretation None       11:27 PM Pt reports she is feeling much better, is able to take a deep breath, no further chest tightness.  Lungs CTAB, breathing easily.    MDM   Final diagnoses:  Bronchitis    Afebrile, nontoxic patient with 2 weeks of cough and increased SOB, started as viral process now with secondary worsening.  Counseled on smoking cessation > 5 minutes.  CXR with bronchitic changes.  Pt feeling better after nebs.  Given IM decadron and toradol.  Great improvement in ED.  D/C home with prednisone, z-pak, PCP follow up.  Discussed result, findings, treatment, and follow up  with patient.  Pt given return precautions.  Pt verbalizes understanding and agrees with plan.       I doubt any other EMC precluding discharge at this time including, but not necessarily limited to the following:  ACS, PE, aortic dissection, pneuothorax.     Trixie Dredgemily Benna Arno, PA-C 07/15/15 2336  Rolland PorterMark James, MD 07/30/15 [phone]

## 2015-08-23 ENCOUNTER — Encounter (HOSPITAL_COMMUNITY): Payer: Self-pay | Admitting: *Deleted

## 2015-08-23 ENCOUNTER — Emergency Department (HOSPITAL_COMMUNITY)
Admission: EM | Admit: 2015-08-23 | Discharge: 2015-08-23 | Disposition: A | Discharge status: Home / Self Care | Payer: Medicaid Other | Attending: Physician Assistant | Admitting: Physician Assistant

## 2015-08-23 DIAGNOSIS — M779 Enthesopathy, unspecified: Secondary | ICD-10-CM

## 2015-08-23 DIAGNOSIS — Z79899 Other long term (current) drug therapy: Secondary | ICD-10-CM | POA: Insufficient documentation

## 2015-08-23 DIAGNOSIS — F17210 Nicotine dependence, cigarettes, uncomplicated: Secondary | ICD-10-CM | POA: Insufficient documentation

## 2015-08-23 DIAGNOSIS — J45909 Unspecified asthma, uncomplicated: Secondary | ICD-10-CM | POA: Insufficient documentation

## 2015-08-23 MED ORDER — LIDOCAINE 5 % EX PTCH: 1.0000 | MEDICATED_PATCH | CUTANEOUS | 0 refills | Status: DC

## 2015-08-23 MED ORDER — METHOCARBAMOL 500 MG PO TABS: 500.0000 mg | ORAL_TABLET | Freq: Four times a day (QID) | ORAL | 0 refills | Status: DC | PRN

## 2015-08-23 MED ORDER — MELOXICAM 7.5 MG PO TABS: 7.5000 mg | ORAL_TABLET | Freq: Every day | ORAL | 0 refills | Status: DC

## 2015-08-23 NOTE — ED Provider Notes (Signed)
[facility]-EMERGENCY DEPT Provider Note   CSN: [id number] Arrival date & time: 08/23/15  1137  First Provider Contact:   First MD Initiated Contact with Patient 08/23/15 1217     By signing my name below, I, Freida Busmaniana Omoyeni, attest that this documentation has been prepared under the direction and in the presence of non-physician practitioner, Trixie DredgeEmily Alveda Vanhorne, PA-C. Electronically Signed: Freida Busmaniana Omoyeni, Scribe. 08/23/2015. 12:47 PM.   History   Chief Complaint Chief Complaint  Patient presents with  . Hand Pain    The history is provided by the patient. No language interpreter was used.    HPI Comments:  Rogue BussingBobbie Samano is a 45 y.o. female who presents to the Emergency Department complaining of persistent numbness to the 4th and 5th fingers of the bilateral hands with associated burning and tingling from the wrist area up through the forearm and to the elbow, R>L x 10 days. Pt notes she uses a 30-40 pound immersion blender in a stirring motion daily at work. She also reports repetitive scooping motion from scooping out avocados.  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 Pt is right hand dominant. She has taken ibuprofen, aleve, applied tiger balm, iced and applied warm water without relief.    Past Medical History:  Diagnosis Date  . Asthma   . Bronchitis   . Thyroid disease     There are no active problems to display for this patient.   Past Surgical History:  Procedure Laterality Date  . CHOLECYSTECTOMY    . TUBAL LIGATION      OB History    No data available       Home Medications    Prior to Admission medications   Medication Sig Start Date End Date Taking? Authorizing Provider  albuterol (PROVENTIL HFA;VENTOLIN HFA) 108 (90 BASE) MCG/ACT inhaler Inhale 2 puffs into the lungs every 6 (six) hours as needed. For wheezing    Historical Provider, MD  azithromycin (ZITHROMAX) 250 MG tablet Take 1 tablet (250 mg  total) by mouth daily. Take first 2 tablets together, then 1 every day until finished. 07/15/15   Trixie DredgeEmily Tarynn Garling, PA-C  chlorpheniramine-HYDROcodone (TUSSIONEX PENNKINETIC ER) 10-8 MG/5ML LQCR Take 5 mLs by mouth 2 (two) times daily. Patient not taking: Reported on 03/28/2014 03/09/14   Tammy Triplett, PA-C  doxycycline (VIBRAMYCIN) 100 MG capsule Take 1 capsule (100 mg total) by mouth 2 (two) times daily. Patient not taking: Reported on 03/28/2014 03/09/14   Tammy Triplett, PA-C  EPINEPHrine (EPIPEN) 0.3 mg/0.3 mL IJ SOAJ injection Inject 0.3 mLs (0.3 mg total) into the muscle as needed. Patient taking differently: Inject 0.3 mg into the muscle as needed (for anaphylactic reaction).  08/04/13   Margarita Grizzleanielle Ray, MD  fluticasone (FLONASE) 50 MCG/ACT nasal spray Place 1 spray into both nostrils 2 (two) times daily.    Historical Provider, MD  ibuprofen (ADVIL,MOTRIN) 800 MG tablet Take 1 tablet (800 mg total) by mouth 3 (three) times daily. Take with food 04/23/14   Tammy Triplett, PA-C  ipratropium-albuterol (DUONEB) 0.5-2.5 (3) MG/3ML SOLN Take 3 mLs by nebulization every 4 (four) hours as needed (shortness of breath).     Historical Provider, MD  lidocaine (LIDODERM) 5 % Place 1 patch onto the skin daily. Remove & Discard patch within 12 hours or as directed by MD 08/23/15   Trixie DredgeEmily Wilder Kurowski, PA-C  loratadine (CLARITIN) 10 MG tablet Take 10 mg by mouth daily as needed  for allergies.    Historical Provider, MD  meloxicam (MOBIC) 7.5 MG tablet Take 1-2 tablets (7.5-15 mg total) by mouth daily. 08/23/15   Trixie Dredge, PA-C  methocarbamol (ROBAXIN) 500 MG tablet Take 1-2 tablets (500-1,000 mg total) by mouth every 6 (six) hours as needed for muscle spasms (and pain). 08/23/15   Trixie Dredge, PA-C  predniSONE (STERAPRED UNI-PAK 21 TAB) 10 MG (21) TBPK tablet Take 1 tablet (10 mg total) by mouth daily. Day 1: take 6 tabs.  Day 2: 5 tabs  Day 3: 4 tabs  Day 4: 3 tabs  Day 5: 2 tabs  Day 6: 1 tab 07/15/15   Trixie Dredge, PA-C    Family  History No family history on file.  Social History Social History  Substance Use Topics  . Smoking status: Current Every Day Smoker    Packs/day: 1.00    Types: Cigarettes  . Smokeless tobacco: Never Used  . Alcohol use No     Allergies   Bee venom; Penicillins; Shellfish-derived products; Codeine; Gabapentin; Latex; Oxybutynin; and Tape   Review of Systems Review of Systems  Constitutional: Negative for fever.  Musculoskeletal: Positive for arthralgias and myalgias.  Skin: Negative for color change, pallor and wound.  Allergic/Immunologic: Negative for immunocompromised state.  Neurological: Positive for numbness.  Hematological: Does not bruise/bleed easily.  Psychiatric/Behavioral: Negative for self-injury.   Physical Exam Updated Vital Signs BP 150/91 (BP Location: Left Arm)   Pulse 73   Temp 98.1 F (36.7 C)   Resp 18   LMP 06/29/2011   SpO2 98%   Physical Exam  Constitutional: She appears well-developed and well-nourished. No distress.  HENT:  Head: Normocephalic and atraumatic.  Neck: Neck supple.  Cardiovascular: Normal rate.   Pulmonary/Chest: Effort normal.  Musculoskeletal:  Tenderness at the medial epicondyly bilaterally; elbows with tenderness and edema of the forearm musculature of the ulnar side with decreased sensation in 4th and 5th fingers without tenderness  Mild edema throughoput the right hand  FAROM of all joints; No erythema Distal pulses intact  Neurological: She is alert.  Skin: Capillary refill takes less than 2 seconds. She is not diaphoretic.  Psychiatric: Her behavior is normal.  Nursing note and vitals reviewed.   ED Treatments / Results  DIAGNOSTIC STUDIES:  Oxygen Saturation is 98% on RA, normal by my interpretation.    COORDINATION OF CARE:  12:31 PM Discussed treatment plan with pt at bedside and pt agreed to plan.  Labs (all labs ordered are listed, but only abnormal results are displayed) Labs Reviewed - No data to  display  EKG  EKG Interpretation None       Radiology No results found.  Procedures Procedures   Medications Ordered in ED Medications - No data to display   Initial Impression / Assessment and Plan / ED Course  I have reviewed the triage vital signs and the nursing notes.  Pertinent labs & imaging results that were available during my care of the patient were reviewed by me and considered in my medical decision making (see chart for details).  Clinical Course      Final Clinical Impressions(s) / ED Diagnoses  Patient with bilateral forearm pain with numbness in her 4th and 5th fingers.  Tenderness over the medial epicondyle.  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  Symptoms began when she started this new activity.  Suspect tendonitis vs muscle soreness/strain.  Pt advised to follow up with orthopedics, has  seen Dr Merlyn Lot previously. Patient given ace warp while in ED, mobic, robaxin, RICE, conservative therapy recommended and discussed. Patient will be discharged home & is agreeable with above plan. Returns precautions discussed. Pt appears safe for discharge.  Discussed result, findings, treatment, and follow up  with patient.  Pt given return precautions.  Pt verbalizes understanding and agrees with plan.      Final diagnoses:  Tendonitis    New Prescriptions Discharge Medication List as of 08/23/2015 12:50 PM    START taking these medications   Details  lidocaine (LIDODERM) 5 % Place 1 patch onto the skin daily. Remove & Discard patch within 12 hours or as directed by MD, Starting Sun 08/23/2015, Print    meloxicam (MOBIC) 7.5 MG tablet Take 1-2 tablets (7.5-15 mg total) by mouth daily., Starting Sun 08/23/2015, Print    methocarbamol (ROBAXIN) 500 MG tablet Take 1-2 tablets (500-1,000 mg total) by mouth every 6 (six) hours as needed for muscle spasms (and pain)., Starting Sun 08/23/2015, Print       I personally performed the services  described in this documentation, which was scribed in my presence. The recorded information has been reviewed and is accurate.    Trixie Dredge, PA-C 08/23/15 1458    Courteney Randall An, MD 08/23/15 1816

## 2015-08-23 NOTE — Discharge Instructions (Signed)
Read the information below.  Use the prescribed medication as directed.  Please discuss all new medications with your pharmacist.  You may return to the Emergency Department at any time for worsening condition or any new symptoms that concern you.  If you develop uncontrolled pain, weakness or numbness of the extremity, severe discoloration of the skin, or you are unable to use your hands, return to the ER for a recheck.

## 2015-08-23 NOTE — ED Notes (Signed)
Bed: WA26 Expected date:  Expected time:  Means of arrival:  Comments: 

## 2015-08-23 NOTE — ED Triage Notes (Signed)
Pt complains of pain, decreased sensation and tingling in both of her hands for the past 10 days. 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 Pt states she has been using an immersion blender and cutting with knives for the past 10 days.

## 2015-08-23 NOTE — Progress Notes (Addendum)
Pt works for [employer] and has to lift very heavy containers and for the last ten days pt has been having increasing weakness and pain in both arms and hands,. Pt c/o numbness in bilateral ring and pinky fingers.pt states the pain is so bad that it wakes her up at night. Pt does have some edema to all fingers with positive capillary refill. Pt has positive radial pulses. (12noon)Pt is soaking her right hand .

## 2015-10-07 ENCOUNTER — Emergency Department (HOSPITAL_COMMUNITY)
Admission: EM | Admit: 2015-10-07 | Discharge: 2015-10-07 | Disposition: A | Discharge status: Home / Self Care | Payer: Medicaid Other | Attending: Emergency Medicine | Admitting: Emergency Medicine

## 2015-10-07 ENCOUNTER — Encounter (HOSPITAL_COMMUNITY): Payer: Self-pay | Admitting: Emergency Medicine

## 2015-10-07 DIAGNOSIS — Z79899 Other long term (current) drug therapy: Secondary | ICD-10-CM | POA: Insufficient documentation

## 2015-10-07 DIAGNOSIS — F17210 Nicotine dependence, cigarettes, uncomplicated: Secondary | ICD-10-CM | POA: Insufficient documentation

## 2015-10-07 DIAGNOSIS — J019 Acute sinusitis, unspecified: Secondary | ICD-10-CM

## 2015-10-07 DIAGNOSIS — J45909 Unspecified asthma, uncomplicated: Secondary | ICD-10-CM | POA: Insufficient documentation

## 2015-10-07 DIAGNOSIS — Z91040 Latex allergy status: Secondary | ICD-10-CM | POA: Insufficient documentation

## 2015-10-07 MED ORDER — DOXYCYCLINE HYCLATE 100 MG PO CAPS: 100.0000 mg | ORAL_CAPSULE | Freq: Two times a day (BID) | ORAL | 0 refills | Status: DC

## 2015-10-07 NOTE — ED Triage Notes (Addendum)
Patient states for the past 6 weeks she has had cough, nasal congestion, and sinus pressure.

## 2015-10-07 NOTE — Discharge Instructions (Signed)
Read the information below.  Use the prescribed medication as directed.  Please discuss all new medications with your pharmacist.  You may return to the Emergency Department at any time for worsening condition or any new symptoms that concern you.    If you develop high fevers, severe facial pain, difficulty swallowing or breathing, see your doctor or return to the Emergency Department immediately for a recheck.

## 2015-10-07 NOTE — ED Provider Notes (Signed)
[facility]-EMERGENCY DEPT Provider Note   CSN: [id number] Arrival date & time: 10/07/15  1145  By signing my name below, I, Sheila Conway, attest that this documentation has been prepared under the direction and in the presence of [facility] CenterEmily Kenyana Husak, PA-C. Electronically Signed: Sandrea HammondStephen Conway, ED Scribe. 10/07/15. 12:47 PM.   History   Chief Complaint Chief Complaint  Patient presents with  . URI    HPI Comments: Sheila Conway is a 45 y.o. female with h/o asthma who presents to the Emergency Department complaining of moderate to severe sinus pressure that began 6 days ago. Pt also reports associated nasal congestion, productive cough with yellow sputum, left ear pain, post nasal drip that has been ongoing and gradually worsening for 6 weeks. Pt states her sinus pain worsens to a throbbing pain when leaning forward or lying flat. Pt reports she has tried Flonase, Mucinex, Claritin, Sudafed, Tylenol as well as several OTC remedies with no improvement. Per pt, she has not taken any OTC medications in 3 days as she saw no improvement of her symptoms. Pt is a smoker. She denies chest congestion, fevers, chills, body aches, gingival pain, right ear pain.  The history is provided by the patient. No language interpreter was used.    Past Medical History:  Diagnosis Date  . Asthma   . Bronchitis   . Thyroid disease     There are no active problems to display for this patient.   Past Surgical History:  Procedure Laterality Date  . CHOLECYSTECTOMY    . TUBAL LIGATION      OB History    No data available       Home Medications    Prior to Admission medications   Medication Sig Start Date End Date Taking? Authorizing Provider  albuterol (PROVENTIL HFA;VENTOLIN HFA) 108 (90 BASE) MCG/ACT inhaler Inhale 2 puffs into the lungs every 6 (six) hours as needed. For wheezing    Historical Provider, MD  azithromycin (ZITHROMAX) 250 MG tablet Take 1 tablet (250 mg total) by mouth daily. Take first 2  tablets together, then 1 every day until finished. 07/15/15   Sheila DredgeEmily Yuridia Couts, PA-C  chlorpheniramine-HYDROcodone (TUSSIONEX PENNKINETIC ER) 10-8 MG/5ML LQCR Take 5 mLs by mouth 2 (two) times daily. Patient not taking: Reported on 03/28/2014 03/09/14   Sheila Triplett, PA-C  doxycycline (VIBRAMYCIN) 100 MG capsule Take 1 capsule (100 mg total) by mouth 2 (two) times daily. One po bid x 7 days 10/07/15   Sheila DredgeEmily Joakim Huesman, PA-C  EPINEPHrine (EPIPEN) 0.3 mg/0.3 mL IJ SOAJ injection Inject 0.3 mLs (0.3 mg total) into the muscle as needed. Patient taking differently: Inject 0.3 mg into the muscle as needed (for anaphylactic reaction).  08/04/13   Sheila Grizzleanielle Ray, MD  fluticasone (FLONASE) 50 MCG/ACT nasal spray Place 1 spray into both nostrils 2 (two) times daily.    Historical Provider, MD  ibuprofen (ADVIL,MOTRIN) 800 MG tablet Take 1 tablet (800 mg total) by mouth 3 (three) times daily. Take with food 04/23/14   Sheila Triplett, PA-C  ipratropium-albuterol (DUONEB) 0.5-2.5 (3) MG/3ML SOLN Take 3 mLs by nebulization every 4 (four) hours as needed (shortness of breath).     Historical Provider, MD  lidocaine (LIDODERM) 5 % Place 1 patch onto the skin daily. Remove & Discard patch within 12 hours or as directed by MD 08/23/15   Sheila DredgeEmily Vermon Grays, PA-C  loratadine (CLARITIN) 10 MG tablet Take 10 mg by mouth daily as needed for allergies.    Historical Provider, MD  meloxicam [facility](MOBIC)  7.5 MG tablet Take 1-2 tablets (7.5-15 mg total) by mouth daily. 08/23/15   Sheila Dredge, PA-C  methocarbamol (ROBAXIN) 500 MG tablet Take 1-2 tablets (500-1,000 mg total) by mouth every 6 (six) hours as needed for muscle spasms (and pain). 08/23/15   Sheila Dredge, PA-C  predniSONE (STERAPRED UNI-PAK 21 TAB) 10 MG (21) TBPK tablet Take 1 tablet (10 mg total) by mouth daily. Day 1: take 6 tabs.  Day 2: 5 tabs  Day 3: 4 tabs  Day 4: 3 tabs  Day 5: 2 tabs  Day 6: 1 tab 07/15/15   Sheila Dredge, PA-C    Family History No family history on file.  Social History Social  History  Substance Use Topics  . Smoking status: Current Every Day Smoker    Packs/day: 1.00    Types: Cigarettes  . Smokeless tobacco: Never Used  . Alcohol use No     Allergies   Bee venom; Penicillins; Shellfish-derived products; Codeine; Gabapentin; Latex; Oxybutynin; and Tape   Review of Systems Review of Systems  Constitutional: Negative for chills and fever.  HENT: Positive for congestion, ear pain, postnasal drip and sinus pressure. Negative for dental problem and sore throat.   Respiratory: Positive for cough. Negative for shortness of breath.   Musculoskeletal: Negative for arthralgias.  Skin: Negative for color change and rash.     Physical Exam Updated Vital Signs BP 122/89 (BP Location: Left Arm)   Pulse 95   Temp 98.1 F (36.7 C) (Oral)   Resp 18   Ht 5\' 5"  (1.651 m)   Wt 184 lb (83.5 kg)   LMP 06/29/2011   SpO2 96%   BMI 30.62 kg/m   Physical Exam  Constitutional: She appears well-developed and well-nourished. No distress.  HENT:  Head: Normocephalic and atraumatic.  Mouth/Throat: No oropharyngeal exudate.  Bilateral nares with edematous, erythematous mucous, clear discharge. Frontal and maxillary sinuses tender to percussion bilaterally. Mild erythema of the oropharynx without edema or exudates.  Bilateral TMs clear, normal  Eyes: Conjunctivae are normal.  Neck: Normal range of motion. Neck supple.  Cardiovascular: Normal rate and regular rhythm.   Pulmonary/Chest: Effort normal and breath sounds normal. No stridor. No respiratory distress. She has no wheezes. She has no rales.  Lymphadenopathy:    She has no cervical adenopathy.  Neurological: She is alert.  Skin: She is not diaphoretic.  Nursing note and vitals reviewed.    ED Treatments / Results   DIAGNOSTIC STUDIES: Oxygen Saturation is 96% on RA, adequate by my interpretation.    COORDINATION OF CARE: 12:40 PM Discussed treatment plan with pt at bedside which includes antibiotics  and pt agreed to plan.   Labs (all labs ordered are listed, but only abnormal results are displayed) Labs Reviewed - No data to display  EKG  EKG Interpretation None       Radiology No results found.  Procedures Procedures (including critical care time)  Medications Ordered in ED Medications - No data to display   Initial Impression / Assessment and Plan / ED Course  I have reviewed the triage vital signs and the nursing notes.  Pertinent labs & imaging results that were available during my care of the patient were reviewed by me and considered in my medical decision making (see chart for details).  Clinical Course    Afebrile, nontoxic patient with 6 weeks of URI symptoms now with increased congestion and severe facial pain. Likely bacterial component to sinusitis.   D/C home with  doxycycline, PCP follow up.  Discussed result, findings, treatment, and follow up  with patient.  Pt given return precautions.  Pt verbalizes understanding and agrees with plan.       Final Clinical Impressions(s) / ED Diagnoses   Final diagnoses:  Acute sinusitis, recurrence not specified, unspecified location    New Prescriptions Discharge Medication List as of 10/07/2015 12:52 PM     I personally performed the services described in this documentation, which was scribed in my presence. The recorded information has been reviewed and is accurate.     Sheila Dredge, PA-C 10/07/15 1602    Canary Brim Tegeler, MD 10/07/15 2015

## 2016-01-22 ENCOUNTER — Emergency Department (HOSPITAL_COMMUNITY): Payer: Worker's Compensation

## 2016-01-22 ENCOUNTER — Encounter (HOSPITAL_COMMUNITY): Payer: Self-pay

## 2016-01-22 ENCOUNTER — Emergency Department (HOSPITAL_COMMUNITY)
Admission: EM | Admit: 2016-01-22 | Discharge: 2016-01-22 | Disposition: A | Discharge status: Home / Self Care | Payer: Worker's Compensation | Attending: Emergency Medicine | Admitting: Emergency Medicine

## 2016-01-22 DIAGNOSIS — S61412A Laceration without foreign body of left hand, initial encounter: Secondary | ICD-10-CM

## 2016-01-22 DIAGNOSIS — Y990 Civilian activity done for income or pay: Secondary | ICD-10-CM | POA: Insufficient documentation

## 2016-01-22 DIAGNOSIS — Y9289 Other specified places as the place of occurrence of the external cause: Secondary | ICD-10-CM | POA: Insufficient documentation

## 2016-01-22 DIAGNOSIS — W260XXA Contact with knife, initial encounter: Secondary | ICD-10-CM | POA: Insufficient documentation

## 2016-01-22 DIAGNOSIS — Y9389 Activity, other specified: Secondary | ICD-10-CM | POA: Insufficient documentation

## 2016-01-22 DIAGNOSIS — Z23 Encounter for immunization: Secondary | ICD-10-CM | POA: Insufficient documentation

## 2016-01-22 MED ORDER — NAPROXEN 250 MG PO TABS: 250.0000 mg | ORAL_TABLET | Freq: Two times a day (BID) | ORAL | 0 refills | Status: DC

## 2016-01-22 MED ORDER — TETANUS-DIPHTH-ACELL PERTUSSIS 5-2.5-18.5 LF-MCG/0.5 IM SUSP
0.5000 mL | Freq: Once | INTRAMUSCULAR | Status: AC
  Administered 2016-01-22: 0.5 mL via INTRAMUSCULAR
  Filled 2016-01-22: qty 0.5

## 2016-01-22 MED ORDER — LIDOCAINE-EPINEPHRINE (PF) 2 %-1:200000 IJ SOLN
10.0000 mL | Freq: Once | INTRAMUSCULAR | Status: AC
  Administered 2016-01-22: 10 mL
  Filled 2016-01-22: qty 20

## 2016-01-22 MED ORDER — BACITRACIN ZINC 500 UNIT/GM EX OINT: 1.0000 "application " | TOPICAL_OINTMENT | Freq: Two times a day (BID) | CUTANEOUS | 1 refills | Status: DC

## 2016-01-22 NOTE — ED Notes (Signed)
Patient returned from X-ray 

## 2016-01-22 NOTE — ED Triage Notes (Signed)
Per pt, pt is coming from work where she was using a filet knife to cut an avocado when she cut through her left head. Bleeding controlled. Pt is able to move hand. Laceration noted to be approximately one inch.

## 2016-01-22 NOTE — ED Provider Notes (Signed)
[facility] Provider Note   CSN: [id number] Arrival date & time: 01/22/16  1159   By signing my name below, I, Cynda AcresHailei Fulton, attest that this documentation has been prepared under the direction and in the presence of Everlene FarrierWilliam Denzell Colasanti, PA-C Electronically Signed: Cynda AcresHailei Fulton, Scribe. 01/22/16. 12:34 PM.  History   Chief Complaint Chief Complaint  Patient presents with  . Extremity Laceration    HPI Comments: Sheila Conway is a 46 y.o. female who is right hand dominant who presents to the Emergency Department complaining of right hand pain s/p laceration. Patient states she was at work using a filet knife to cut an avocado, when she slipped and cut her left hand.  She is right handed. No modifying factors indicating. She states her last tetanus shot was 20 years ago. She denies any numbness, tingling, weakness, or fever.   The history is provided by the patient. No language interpreter was used.    Past Medical History:  Diagnosis Date  . Asthma   . Bronchitis   . Thyroid disease     There are no active problems to display for this patient.   Past Surgical History:  Procedure Laterality Date  . CHOLECYSTECTOMY    . TUBAL LIGATION      OB History    No data available       Home Medications    Prior to Admission medications   Medication Sig Start Date End Date Taking? Authorizing Provider  albuterol (PROVENTIL HFA;VENTOLIN HFA) 108 (90 BASE) MCG/ACT inhaler Inhale 2 puffs into the lungs every 6 (six) hours as needed. For wheezing    Historical Provider, MD  azithromycin (ZITHROMAX) 250 MG tablet Take 1 tablet (250 mg total) by mouth daily. Take first 2 tablets together, then 1 every day until finished. 07/15/15   Trixie DredgeEmily West, PA-C  bacitracin ointment Apply 1 application topically 2 (two) times daily. 01/22/16   Everlene FarrierWilliam Treacy Holcomb, PA-C  chlorpheniramine-HYDROcodone (TUSSIONEX PENNKINETIC ER) 10-8 MG/5ML LQCR Take 5 mLs by mouth 2 (two) times daily. Patient not taking:  Reported on 03/28/2014 03/09/14   Tammy Triplett, PA-C  doxycycline (VIBRAMYCIN) 100 MG capsule Take 1 capsule (100 mg total) by mouth 2 (two) times daily. One po bid x 7 days 10/07/15   Trixie DredgeEmily West, PA-C  EPINEPHrine (EPIPEN) 0.3 mg/0.3 mL IJ SOAJ injection Inject 0.3 mLs (0.3 mg total) into the muscle as needed. Patient taking differently: Inject 0.3 mg into the muscle as needed (for anaphylactic reaction).  08/04/13   Margarita Grizzleanielle Ray, MD  fluticasone (FLONASE) 50 MCG/ACT nasal spray Place 1 spray into both nostrils 2 (two) times daily.    Historical Provider, MD  ibuprofen (ADVIL,MOTRIN) 800 MG tablet Take 1 tablet (800 mg total) by mouth 3 (three) times daily. Take with food 04/23/14   Tammy Triplett, PA-C  ipratropium-albuterol (DUONEB) 0.5-2.5 (3) MG/3ML SOLN Take 3 mLs by nebulization every 4 (four) hours as needed (shortness of breath).     Historical Provider, MD  lidocaine (LIDODERM) 5 % Place 1 patch onto the skin daily. Remove & Discard patch within 12 hours or as directed by MD 08/23/15   Trixie DredgeEmily West, PA-C  loratadine (CLARITIN) 10 MG tablet Take 10 mg by mouth daily as needed for allergies.    Historical Provider, MD  meloxicam (MOBIC) 7.5 MG tablet Take 1-2 tablets (7.5-15 mg total) by mouth daily. 08/23/15   Trixie DredgeEmily West, PA-C  methocarbamol (ROBAXIN) 500 MG tablet Take 1-2 tablets (500-1,000 mg total) by mouth every 6 (six)  hours as needed for muscle spasms (and pain). 08/23/15   Trixie Dredge, PA-C  naproxen (NAPROSYN) 250 MG tablet Take 1 tablet (250 mg total) by mouth 2 (two) times daily with a meal. 01/22/16   Everlene Farrier, PA-C  predniSONE (STERAPRED UNI-PAK 21 TAB) 10 MG (21) TBPK tablet Take 1 tablet (10 mg total) by mouth daily. Day 1: take 6 tabs.  Day 2: 5 tabs  Day 3: 4 tabs  Day 4: 3 tabs  Day 5: 2 tabs  Day 6: 1 tab 07/15/15   Trixie Dredge, PA-C    Family History No family history on file.  Social History Social History  Substance Use Topics  . Smoking status: Current Every Day Smoker     Packs/day: 1.00    Types: Cigarettes  . Smokeless tobacco: Never Used  . Alcohol use No     Allergies   Bee venom; Penicillins; Shellfish-derived products; Codeine; Gabapentin; Latex; Oxybutynin; and Tape   Review of Systems Review of Systems  Constitutional: Negative for fever.  Musculoskeletal: Negative for arthralgias.  Skin: Positive for wound. Negative for rash.  Neurological: Negative for weakness and numbness.     Physical Exam Updated Vital Signs BP 138/73 (BP Location: Left Arm)   Pulse 81   Temp 98.3 F (36.8 C) (Oral)   Resp 18   Ht 5\' 5"  (1.651 m)   Wt 74.8 kg   LMP 06/29/2011   SpO2 96%   BMI 27.46 kg/m   Physical Exam  Constitutional: She appears well-developed and well-nourished. No distress.  Nontoxic appearing.  HENT:  Head: Normocephalic and atraumatic.  Eyes: Right eye exhibits no discharge. Left eye exhibits no discharge.  Cardiovascular: Normal rate, regular rhythm and intact distal pulses.   Bilateral radial pulses are intact. Good capillary refill to her bilateral distal fingertips.  Pulmonary/Chest: Effort normal. No respiratory distress.  Musculoskeletal: Normal range of motion. She exhibits tenderness. She exhibits no edema or deformity.  3.5 centimeter laceration of the medial aspect of her left palm. Bleeding is controlled. No deep tissue injury. Good strength range of motion to her left hand. Good strength with flexion and extension at her left fingertips.  Neurological: She is alert. No sensory deficit. Coordination normal.  Sensation intact in her bilateral distal fingertips.  Skin: Skin is warm and dry. Capillary refill takes less than 2 seconds. No rash noted. She is not diaphoretic. No erythema. No pallor.  Psychiatric: She has a normal mood and affect. Her behavior is normal.  Nursing note and vitals reviewed.    ED Treatments / Results  DIAGNOSTIC STUDIES: Oxygen Saturation is 96% on RA, normal by my interpretation.     COORDINATION OF CARE: 12:34 PM Discussed treatment plan with pt at bedside and pt agreed to plan.  Labs (all labs ordered are listed, but only abnormal results are displayed) Labs Reviewed - No data to display  EKG  EKG Interpretation None       Radiology Dg Hand Complete Left  Result Date: 01/22/2016 CLINICAL DATA:  Hand laceration with knife cutting vegetables EXAM: LEFT HAND - COMPLETE 3+ VIEW COMPARISON:  None. FINDINGS: Three views of the left hand submitted. No acute fracture or subluxation. No radiopaque foreign body. IMPRESSION: Negative. Electronically Signed   By: Natasha Mead M.D.   On: 01/22/2016 13:02    Procedures .Marland KitchenLaceration Repair Date/Time: 01/22/2016 1:58 PM Performed by: Everlene Farrier Authorized by: Everlene Farrier   Consent:    Consent obtained:  Verbal   Consent  given by:  Patient   Risks discussed:  Infection, pain, poor cosmetic result and need for additional repair Anesthesia (see MAR for exact dosages):    Anesthesia method:  Local infiltration   Local anesthetic:  Lidocaine 2% WITH epi Laceration details:    Location:  Hand   Hand location:  L palm   Length (cm):  3.5 Repair type:    Repair type:  Simple Pre-procedure details:    Preparation:  Patient was prepped and draped in usual sterile fashion and imaging obtained to evaluate for foreign bodies Exploration:    Hemostasis achieved with:  Direct pressure   Wound exploration: wound explored through full range of motion and entire depth of wound probed and visualized     Wound extent: no foreign bodies/material noted, no muscle damage noted, no tendon damage noted, no underlying fracture noted and no vascular damage noted     Contaminated: no   Treatment:    Area cleansed with:  Saline   Amount of cleaning:  Extensive   Irrigation solution:  Sterile saline   Irrigation method:  Pressure wash Skin repair:    Repair method:  Sutures   Suture size:  4-0   Suture material:  Prolene    Suture technique:  Simple interrupted   Number of sutures:  3 Approximation:    Approximation:  Close Post-procedure details:    Dressing:  Antibiotic ointment and non-adherent dressing   Patient tolerance of procedure:  Tolerated well, no immediate complications   (including critical care time)  Medications Ordered in ED Medications  lidocaine-EPINEPHrine (XYLOCAINE W/EPI) 2 %-1:200000 (PF) injection 10 mL (10 mLs Infiltration Given 01/22/16 1241)  Tdap (BOOSTRIX) injection 0.5 mL (0.5 mLs Intramuscular Given 01/22/16 1240)     Initial Impression / Assessment and Plan / ED Course  I have reviewed the triage vital signs and the nursing notes.  Pertinent labs & imaging results that were available during my care of the patient were reviewed by me and considered in my medical decision making (see chart for details).  Clinical Course    Patient presents with a laceration to the medial aspect of her left hand after attending to cut an avocado. She is a 3.5 cm laceration that is superficial to the medial aspect of her left palm. Bleeding is controlled. X-rays unremarkable. Laceration repair by me and tolerated well by the patient. Advised wound care instructions. I discussed return precautions. I advised the patient to follow-up with their primary care provider this week. I advised the patient to return to the emergency department with new or worsening symptoms or new concerns. The patient verbalized understanding and agreement with plan.    Final Clinical Impressions(s) / ED Diagnoses   Final diagnoses:  Laceration of left hand without foreign body, initial encounter    New Prescriptions Discharge Medication List as of 01/22/2016  2:08 PM    START taking these medications   Details  bacitracin ointment Apply 1 application topically 2 (two) times daily., Starting Fri 01/22/2016, Print    naproxen (NAPROSYN) 250 MG tablet Take 1 tablet (250 mg total) by mouth 2 (two) times daily with a meal.,  Starting Fri 01/22/2016, Print       I personally performed the services described in this documentation, which was scribed in my presence. The recorded information has been reviewed and is accurate.       Everlene Farrier, PA-C 01/22/16 1420    Arby Barrette, MD 01/22/16 [phone]

## 2016-01-22 NOTE — ED Notes (Signed)
Patient transported to X-ray 

## 2017-01-14 ENCOUNTER — Encounter (HOSPITAL_COMMUNITY): Payer: Self-pay | Admitting: Emergency Medicine

## 2017-01-14 ENCOUNTER — Other Ambulatory Visit: Payer: Self-pay

## 2017-01-14 ENCOUNTER — Ambulatory Visit (HOSPITAL_COMMUNITY)
Admission: EM | Admit: 2017-01-14 | Discharge: 2017-01-14 | Disposition: A | Discharge status: Home / Self Care | Payer: BLUE CROSS/BLUE SHIELD | Attending: Family Medicine | Admitting: Family Medicine

## 2017-01-14 DIAGNOSIS — R059 Cough, unspecified: Secondary | ICD-10-CM

## 2017-01-14 DIAGNOSIS — J111 Influenza due to unidentified influenza virus with other respiratory manifestations: Secondary | ICD-10-CM

## 2017-01-14 DIAGNOSIS — R05 Cough: Secondary | ICD-10-CM

## 2017-01-14 MED ORDER — HYDROCODONE-HOMATROPINE 5-1.5 MG/5ML PO SYRP: 5.0000 mL | ORAL_SOLUTION | Freq: Four times a day (QID) | ORAL | 0 refills | Status: DC | PRN

## 2017-01-14 NOTE — ED Triage Notes (Signed)
Pt c/o cough x4 days, runny nose, c/o chest congestion and facial pain.

## 2017-01-17 NOTE — ED Provider Notes (Signed)
[facility]   [id number] 01/14/17 Arrival Time: 1549  ASSESSMENT & PLAN:  1. Influenza   2. Cough     Meds ordered this encounter  Medications  . HYDROcodone-homatropine (HYCODAN) 5-1.5 MG/5ML syrup    Sig: Take 5 mLs by mouth every 6 (six) hours as needed for cough.    Dispense:  90 mL    Refill:  0   Medication sedation precautions. OTC symptom care as needed. May f/u as needed.  Reviewed expectations re: course of current medical issues. Questions answered. Outlined signs and symptoms indicating need for more acute intervention. Patient verbalized understanding. After Visit Summary given.   SUBJECTIVE: History from: patient.  Sheila Conway is a 47 y.o. female who presents with complaint of nasal congestion, post-nasal drainage, and a persistent cough. Onset abrupt, approximately 4 days ago. Overall fatigued with body aches. SOB: none. Wheezing: none. Fever: questions subjective. Overall normal PO intake without n/v. Sick contacts: no. OTC treatment: Cough med without relief.  Social History   Tobacco Use  Smoking Status Current Every Day Smoker  . Packs/day: 1.00  . Types: Cigarettes  Smokeless Tobacco Never Used    ROS: As per HPI.   OBJECTIVE:  Vitals:   01/14/17 1659  BP: 130/86  Pulse: 89  Resp: 16  Temp: 98.4 F (36.9 C)  SpO2: 100%     General appearance: alert; appears fatigued HEENT: nasal congestion; clear runny nose; throat irritation secondary to post-nasal drainage Neck: supple without LAD Lungs: clear to auscultation bilaterally; cough: moderate; no respiratory distress Skin: warm and dry Psychological: alert and cooperative; normal mood and affect   Allergies  Allergen Reactions  . Bee Venom Anaphylaxis  . Penicillins Anaphylaxis and Rash    Has patient had a PCN reaction causing immediate rash, facial/tongue/throat swelling, SOB or lightheadedness with hypotension: Yes Has patient had a PCN reaction causing severe  rash involving mucus membranes or skin necrosis: No Has patient had a PCN reaction that required hospitalization No Has patient had a PCN reaction occurring within the last 10 years: No If all of the above answers are "NO", then may proceed with Cephalosporin use.   Marland Kitchen. Shellfish-Derived Products Anaphylaxis  . Codeine Hives    "Hives all over"  . Gabapentin Other (See Comments)    Uncontrollable shaking/loss of motor skills  . Latex Swelling  . Oxybutynin Other (See Comments)    Paradoxical  . Tape Rash    ONLY USE PAPER TAPE    Past Medical History:  Diagnosis Date  . Asthma   . Bronchitis   . Thyroid disease    No family history on file. Social History   Socioeconomic History  . Marital status: Widowed    Spouse name: Not on file  . Number of children: Not on file  . Years of education: Not on file  . Highest education level: Not on file  Social Needs  . Financial resource strain: Not on file  . Food insecurity - worry: Not on file  . Food insecurity - inability: Not on file  . Transportation needs - medical: Not on file  . Transportation needs - non-medical: Not on file  Occupational History  . Not on file  Tobacco Use  . Smoking status: Current Every Day Smoker    Packs/day: 1.00    Types: Cigarettes  . Smokeless tobacco: Never Used  Substance and Sexual Activity  . Alcohol use: No  . Drug use: No  . Sexual activity: Not on file  Other Topics Concern  . Not on file  Social History Narrative  . Not on file           Mardella Layman, MD 01/17/17 1000

## 2017-08-06 ENCOUNTER — Ambulatory Visit
Admission: EM | Admit: 2017-08-06 | Discharge: 2017-08-06 | Disposition: A | Discharge status: Home / Self Care | Payer: BLUE CROSS/BLUE SHIELD | Attending: Family Medicine | Admitting: Family Medicine

## 2017-08-06 ENCOUNTER — Other Ambulatory Visit: Payer: Self-pay

## 2017-08-06 ENCOUNTER — Encounter: Payer: Self-pay | Admitting: Emergency Medicine

## 2017-08-06 DIAGNOSIS — J209 Acute bronchitis, unspecified: Secondary | ICD-10-CM | POA: Diagnosis not present

## 2017-08-06 MED ORDER — PREDNISONE 50 MG PO TABS: ORAL_TABLET | ORAL | 0 refills | Status: DC

## 2017-08-06 MED ORDER — BENZONATATE 100 MG PO CAPS: 100.0000 mg | ORAL_CAPSULE | Freq: Three times a day (TID) | ORAL | 0 refills | Status: DC | PRN

## 2017-08-06 MED ORDER — DOXYCYCLINE HYCLATE 100 MG PO CAPS: 100.0000 mg | ORAL_CAPSULE | Freq: Two times a day (BID) | ORAL | 0 refills | Status: DC

## 2017-08-06 NOTE — ED Provider Notes (Signed)
[facility]    CSN: [id number] Arrival date & time: 08/06/17  1004  History   Chief Complaint Chief Complaint  Patient presents with  . Cough   HPI  47 year old female presents with cough.  1 week history of cough.  Severe. Productive.  Associated congestion.  Green sputum.  Associated shortness of breath.  No fevers or chills.  She has taken Mucinex and used albuterol without improvement.  Patient is a current smoker.  No known exacerbating factors.  No other associated symptoms.  No other complaints.  Past Medical History:  Diagnosis Date  . Asthma   . Bronchitis   . Thyroid disease    Past Surgical History:  Procedure Laterality Date  . CHOLECYSTECTOMY    . TUBAL LIGATION     OB History   None    Home Medications    Prior to Admission medications   Medication Sig Start Date End Date Taking? Authorizing Provider  fluticasone (FLONASE) 50 MCG/ACT nasal spray Place 1 spray into both nostrils 2 (two) times daily.   Yes [provider]  loratadine (CLARITIN) 10 MG tablet Take 10 mg by mouth daily as needed for allergies.   Yes [provider]  OMEPRAZOLE PO Take by mouth.   Yes [provider]  benzonatate (TESSALON) 100 MG capsule Take 1 capsule (100 mg total) by mouth 3 (three) times daily as needed. 08/06/17   Tommie Samsook, Tamaria Dunleavy G, DO  doxycycline (VIBRAMYCIN) 100 MG capsule Take 1 capsule (100 mg total) by mouth 2 (two) times daily. 08/06/17   Tommie Samsook, Xeng Kucher G, DO  EPINEPHrine (EPIPEN) 0.3 mg/0.3 mL IJ SOAJ injection Inject 0.3 mLs (0.3 mg total) into the muscle as needed. Patient taking differently: Inject 0.3 mg into the muscle as needed (for anaphylactic reaction).  08/04/13   Margarita Grizzleay, Danielle, MD  predniSONE (DELTASONE) 50 MG tablet 1 tablet daily x 5 days. 08/06/17   Tommie Samsook, Alyssa Rotondo G, DO    Social History Social History   Tobacco Use  . Smoking status: Current Every Day Smoker    Packs/day: 1.00    Types: Cigarettes  . Smokeless tobacco:  Never Used  Substance Use Topics  . Alcohol use: No  . Drug use: No   Allergies   Bee venom; Penicillins; Shellfish-derived products; Codeine; Gabapentin; Latex; Oxybutynin; and Tape  Review of Systems Review of Systems  Constitutional: Negative for fever.  Respiratory: Positive for cough.    Physical Exam Triage Vital Signs ED Triage Vitals  Enc Vitals Group     BP 08/06/17 1024 118/71     Pulse Rate 08/06/17 1024 86     Resp 08/06/17 1024 16     Temp 08/06/17 1024 98.6 F (37 C)     Temp Source 08/06/17 1024 Oral     SpO2 08/06/17 1024 97 %     Weight 08/06/17 1021 171 lb (77.6 kg)     Height 08/06/17 1021 5\' 5"  (1.651 m)     Head Circumference --      Peak Flow --      Pain Score 08/06/17 1021 8     Pain Loc --      Pain Edu? --      Excl. in GC? --    No data found.  Updated Vital Signs BP 118/71 (BP Location: Left Arm)   Pulse 86   Temp 98.6 F (37 C) (Oral)   Resp 16   Ht 5\' 5"  (1.651 m)   Wt 171 lb (  77.6 kg)   LMP 06/29/2011   SpO2 97%   BMI 28.46 kg/m   Visual Acuity Right Eye Distance:   Left Eye Distance:   Bilateral Distance:    Right Eye Near:   Left Eye Near:    Bilateral Near:     Physical Exam  Constitutional: She is oriented to person, place, and time. She appears well-developed. No distress.  HENT:  Head: Normocephalic and atraumatic.  Cardiovascular: Normal rate and regular rhythm.  Pulmonary/Chest: Effort normal and breath sounds normal. She has no wheezes. She has no rales.  Neurological: She is alert and oriented to person, place, and time.  Psychiatric: She has a normal mood and affect. Her behavior is normal.  Nursing note and vitals reviewed.  UC Treatments / Results  Labs (all labs ordered are listed, but only abnormal results are displayed) Labs Reviewed - No data to display  EKG None  Radiology No results found.  Procedures Procedures (including critical care time)  Medications Ordered in UC Medications - No  data to display  Initial Impression / Assessment and Plan / UC Course  I have reviewed the triage vital signs and the nursing notes.  Pertinent labs & imaging results that were available during my care of the patient were reviewed by me and considered in my medical decision making (see chart for details).    47 year old female presents with acute bronchitis.  I suspect the patient has underlying COPD.  Placed on doxycycline, prednisone.  Tessalon Perles for cough.  Final Clinical Impressions(s) / UC Diagnoses   Final diagnoses:  Acute bronchitis, unspecified organism   Discharge Instructions   None    ED Prescriptions    Medication Sig Dispense Auth. Provider   doxycycline (VIBRAMYCIN) 100 MG capsule Take 1 capsule (100 mg total) by mouth 2 (two) times daily. 14 capsule Lido Maske G, DO   predniSONE (DELTASONE) 50 MG tablet 1 tablet daily x 5 days. 5 tablet Neshawn Aird G, DO   benzonatate (TESSALON) 100 MG capsule Take 1 capsule (100 mg total) by mouth 3 (three) times daily as needed. 30 capsule Tommie Sams, DO     Controlled Substance Prescriptions [place] Controlled Substance Registry consulted? Not Applicable   Tommie Sams, DO 08/06/17 1056

## 2017-08-06 NOTE — ED Triage Notes (Signed)
Patient c/o cough and chest congestion for a week.  Patient reports green sputum.

## 2019-03-19 ENCOUNTER — Ambulatory Visit
Admission: EM | Admit: 2019-03-19 | Discharge: 2019-03-19 | Disposition: A | Discharge status: Home / Self Care | Payer: BC Managed Care – PPO | Attending: Urgent Care | Admitting: Urgent Care

## 2019-03-19 ENCOUNTER — Encounter: Payer: Self-pay | Admitting: Emergency Medicine

## 2019-03-19 ENCOUNTER — Ambulatory Visit (INDEPENDENT_AMBULATORY_CARE_PROVIDER_SITE_OTHER): Payer: BC Managed Care – PPO

## 2019-03-19 ENCOUNTER — Other Ambulatory Visit: Payer: Self-pay

## 2019-03-19 DIAGNOSIS — M79672 Pain in left foot: Secondary | ICD-10-CM

## 2019-03-19 DIAGNOSIS — Y990 Civilian activity done for income or pay: Secondary | ICD-10-CM

## 2019-03-19 DIAGNOSIS — R2242 Localized swelling, mass and lump, left lower limb: Secondary | ICD-10-CM

## 2019-03-19 DIAGNOSIS — M7989 Other specified soft tissue disorders: Secondary | ICD-10-CM

## 2019-03-19 DIAGNOSIS — W228XXA Striking against or struck by other objects, initial encounter: Secondary | ICD-10-CM

## 2019-03-19 MED ORDER — TRAMADOL-ACETAMINOPHEN 37.5-325 MG PO TABS: 1.0000 | ORAL_TABLET | Freq: Two times a day (BID) | ORAL | 0 refills | Status: AC | PRN

## 2019-03-19 MED ORDER — KETOROLAC TROMETHAMINE 10 MG PO TABS: 10.0000 mg | ORAL_TABLET | Freq: Three times a day (TID) | ORAL | 0 refills | Status: AC | PRN

## 2019-03-19 NOTE — Discharge Instructions (Signed)
It was very nice seeing you today in clinic. Thank you for entrusting me with your care.   Rest, ice, and elevate ankle/foot. Wear splint for support. Limit weight bearing to allow for improvement in symptoms.   Make arrangements to follow up with podiatry in 1 week if not improving. I have provided you the name and office contact information for an excellent local provider. If your symptoms/condition worsens, please seek follow up care either here or in the ER. Please remember, our [facility] providers are "right here with you" when you need [place].   Again, it was my pleasure to take care of you today. Thank you for choosing our clinic. I hope that you start to feel better quickly.   Quentin Mulling, MSN, APRN, FNP-C, CEN Advanced Practice Provider [facility]

## 2019-03-19 NOTE — ED Provider Notes (Signed)
[place], [place]   Name: Sheila Conway DOB: 1970/12/24 MRN: [id number] CSN: [id number] PCP: Avon Gully, MD  Arrival date and time:  03/19/19 0950  Chief Complaint:  Foot Injury and Foot Pain   NOTE: Prior to seeing the patient today, I have reviewed the triage nursing documentation and vital signs. Clinical staff has updated patient's PMH/PSHx, current medication list, and drug allergies/intolerances to ensure comprehensive history available to assist in medical decision making.   History:   HPI: Sheila Conway is a 49 y.o. female who presents today with complaints of pain and swelling in her LEFT foot following injury that occurred at work x 2 weeks ago. Patient reports that she was performing her normal job duties at [employer] in [place] when the injury occurred. She advises that she does not wish to file today's visit under worker compensation. MOI reported to be when a pallet jack ran over the top of her foot. Patient denies previous injury/surgery to her LEFT foot. While the swelling has improves, patient advising that she feels as if the pain has remained constant and is now extending proximally towards her knee. Over the course of the last week patient notes that she has "eaten a whole bottle of ibuprofen" without significant improvement in her pain and swelling.   Past Medical History:  Diagnosis Date  . Asthma   . Bronchitis   . Thyroid disease     Past Surgical History:  Procedure Laterality Date  . CHOLECYSTECTOMY    . TUBAL LIGATION      History reviewed. No pertinent family history.  Social History   Tobacco Use  . Smoking status: Current Every Day Smoker    Packs/day: 1.00    Types: Cigarettes  . Smokeless tobacco: Never Used  Substance Use Topics  . Alcohol use: No  . Drug use: No    There are no problems to display for this patient.   Home Medications:    Current Meds  Medication Sig  . fluticasone (FLONASE) 50 MCG/ACT nasal spray Place 1 spray  into both nostrils 2 (two) times daily.  Marland Kitchen loratadine (CLARITIN) 10 MG tablet Take 10 mg by mouth daily as needed for allergies.  . OMEPRAZOLE PO Take by mouth.    Allergies:   Bee venom, Penicillins, Shellfish-derived products, Codeine, Gabapentin, Latex, Oxybutynin, and Tape  Review of Systems (ROS):  Review of systems NEGATIVE unless otherwise noted in narrative H&P section.   Vital Signs: Today's Vitals   03/19/19 1007 03/19/19 1008 03/19/19 1009  BP:   126/77  Pulse:   81  Resp:   18  Temp:   98 F (36.7 C)  TempSrc:   Oral  SpO2:   100%  Weight:  180 lb (81.6 kg)   Height:  5\' 5"  (1.651 m)   PainSc: 10-Worst pain ever      Physical Exam: Physical Exam  Constitutional: She is oriented to person, place, and time and well-developed, well-nourished, and in no distress.  HENT:  Head: Normocephalic and atraumatic.  Eyes: Pupils are equal, round, and reactive to light.  Cardiovascular: Normal rate, regular rhythm, normal heart sounds and intact distal pulses.  Pulmonary/Chest: Effort normal and breath sounds normal.  Musculoskeletal:     Left foot: Normal range of motion. Swelling and tenderness present. No deformity.       Feet:     Comments: (+) PMS with normal color, temperature and capillary refill.   Neurological: She is alert and oriented to person, place, and time.  Gait normal.  Skin: Skin is warm and dry. No rash noted. She is not diaphoretic.  Psychiatric: Mood, memory, affect and judgment normal.  Nursing note and vitals reviewed.   Urgent Care Treatments / Results:   Orders Placed This Encounter  Procedures  . Ankle splint air cast  . DG Foot Complete Left    LABS: PLEASE NOTE: all labs that were ordered this encounter are listed, however only abnormal results are displayed. Labs Reviewed - No data to display  EKG: -None  RADIOLOGY: DG Foot Complete Left  Result Date: 03/19/2019 CLINICAL DATA:  Foot run over with heavy object 2 weeks ago.  Persistent pain and swelling. EXAM: LEFT FOOT - COMPLETE 3+ VIEW COMPARISON:  None. FINDINGS: The joint spaces are maintained. No acute fracture is identified. Small calcaneal heel spur is noted. IMPRESSION: No acute bony findings. Electronically Signed   By: Rudie Meyer M.D.   On: 03/19/2019 10:52    PROCEDURES: Procedures  MEDICATIONS RECEIVED THIS VISIT: Medications - No data to display  PERTINENT CLINICAL COURSE NOTES/UPDATES:   Initial Impression / Assessment and Plan / Urgent Care Course:  Pertinent labs & imaging results that were available during my care of the patient were personally reviewed by me and considered in my medical decision making (see lab/imaging section of note for values and interpretations).  Sheila Conway is a 49 y.o. female who presents to [facility] today with complaints of Foot Injury and Foot Pain  Patient is well appearing overall in clinic today. She does not appear to be in any acute distress. Presenting symptoms (see HPI) and exam as documented above. Patient with pain and swelling to her LEFT ankle x 2 weeks following work-related trauma. Diagnostic radiographs of the LEFT foot revealed no acute abnormalities; no fracture, dislocation, or effusion. Patient reporting that she tried to pull ankle from under the jack. Discussed contusion versus tendinous/ligamentous injury. Will place in air cast for support. Patient has been taking IBU without relief. Will change to ketorolac. She was educated on complimentary modalities to help with her pain. Patient encouraged to rest, ice, and elevated foot/ankle. She was advised that ice should be applied TID-QID for at least 15-20 minutes at a time; written information provided on today's AVS. Pain is 10/10 in clinic today. Patient is opiate naive. Will send in short course of Ultracet for PRN use.  Discussed that if not improving, patient will need to be seen for further evaluation by podiatry. Name and office contact  information provided on today's AVS for Dr. Gwyneth Revels. Patient advised the she will need to contact the office to schedule an appointment to be seen.   Current clinical condition warrants patient being out of work in order to recover from her current injury/illness. She was provided with the appropriate documentation to provide to her place of employment that will allow for her to RTW on 03/22/2019 with no restrictions.   I have reviewed the follow up and strict return precautions for any new or worsening symptoms. Patient is aware of symptoms that would be deemed urgent/emergent, and would thus require further evaluation either here or in the emergency department. At the time of discharge, she verbalized understanding and consent with the discharge plan as it was reviewed with her. All questions were fielded by provider and/or clinic staff prior to patient discharge.    Final Clinical Impressions / Urgent Care Diagnoses:   Final diagnoses:  Left foot pain  Swelling of left foot  Work  related injury    New Prescriptions:  [place] Controlled Substance Registry consulted? Yes, I have consulted the [place] Controlled Substances Registry for this patient, and feel the risk/benefit ratio today is favorable for proceeding with this prescription for a controlled substance.  . Discussed use of controlled substance medication to treat her acute pain.  o Reviewed  STOP Act regulations  o Clinic does not refill controlled substances over the phone without face to face evaluation.  . Safety precautions reviewed.  o Medications should not be bitten, chewed, sold, or taken with alcohol.  o Avoid use while working, driving, or operating heavy machinery.  o Side effects associated with the use of this particular medication reviewed. - Patient understands that this medication can cause CNS depression, increase her risk of falls, and even lead to overdose that may result in death, if used outside of the parameters  that she and I discussed.  With all of this in mind, she knowingly accepts the risks and responsibilities associated with intended course of treatment, and elects to responsibly proceed as discussed.  Meds ordered this encounter  Medications  . ketorolac (TORADOL) 10 MG tablet    Sig: Take 1 tablet (10 mg total) by mouth every 8 (eight) hours as needed.    Dispense:  15 tablet    Refill:  0  . traMADol-acetaminophen (ULTRACET) 37.5-325 MG tablet    Sig: Take 1 tablet by mouth 2 (two) times daily as needed.    Dispense:  10 tablet    Refill:  0    Recommended Follow up Care:  Patient encouraged to follow up with the following provider within the specified time frame, or sooner as dictated by the severity of her symptoms. As always, she was instructed that for any urgent/emergent care needs, she should seek care either here or in the emergency department for more immediate evaluation.  Follow-up Information    Samara Deist, DPM In 1 week.   Specialty: Podiatry Why: General reassessment of symptoms if not improving Contact information: [place] [address] [phone]         NOTE: This note was prepared using Dragon dictation software along with smaller phrase technology. Despite my best ability to proofread, there is the potential that transcriptional errors may still occur from this process, and are completely unintentional.    Karen Kitchens, NP 03/19/19 1118

## 2019-03-19 NOTE — ED Triage Notes (Signed)
Patient states she ran over her foot with a pallet jack 2 weeks ago. She states her pain is in the top of her left foot and goes up to her knee.

## 2019-03-23 ENCOUNTER — Ambulatory Visit: Payer: BC Managed Care – PPO | Attending: Internal Medicine

## 2019-03-23 ENCOUNTER — Other Ambulatory Visit: Payer: Self-pay

## 2019-03-23 DIAGNOSIS — Z23 Encounter for immunization: Secondary | ICD-10-CM | POA: Insufficient documentation

## 2019-03-23 NOTE — Progress Notes (Signed)
   Covid-19 Vaccination Clinic  Name:  Marnell Mcdaniel    MRN: [id number] DOB: [birth year]  03/23/2019  Ms. Tortora was observed post Covid-19 immunization for 15 minutes without incident. She was provided with Vaccine Information Sheet and instruction to access the V-Safe system.   Ms. Corson was instructed to call 911 with any severe reactions post vaccine: Marland Kitchen Difficulty breathing  . Swelling of face and throat  . A fast heartbeat  . A bad rash all over body  . Dizziness and weakness   Immunizations Administered    Name Date Dose VIS Date Route   Moderna COVID-19 Vaccine 03/23/2019  4:32 PM 0.5 mL 12/18/2018 Intramuscular   Manufacturer: Moderna   Lot: [id number]   NDC: 34373-578-97

## 2019-04-20 ENCOUNTER — Ambulatory Visit: Payer: BC Managed Care – PPO | Attending: Internal Medicine

## 2019-04-20 DIAGNOSIS — Z23 Encounter for immunization: Secondary | ICD-10-CM

## 2019-04-20 NOTE — Progress Notes (Signed)
   Covid-19 Vaccination Clinic  Name:  Sheila Conway    MRN: [id number] DOB: [birth year]  04/20/2019  [name] was observed post Covid-19 immunization for 15 minutes without incident. She was provided with Vaccine Information Sheet and instruction to access the V-Safe system.   [name] was instructed to call 911 with any severe reactions post vaccine: Marland Kitchen Difficulty breathing  . Swelling of face and throat  . A fast heartbeat  . A bad rash all over body  . Dizziness and weakness   Immunizations Administered    Name Date Dose VIS Date Route   Moderna COVID-19 Vaccine 04/20/2019  8:23 AM 0.5 mL 12/18/2018 Intramuscular   Manufacturer: Moderna   Lot: [id number]   NDC: 37169-678-93

## 2019-05-15 ENCOUNTER — Other Ambulatory Visit: Payer: Self-pay | Admitting: Gerontology

## 2019-05-15 DIAGNOSIS — Z1231 Encounter for screening mammogram for malignant neoplasm of breast: Secondary | ICD-10-CM
